# Patient Record
Sex: Female | Born: 1950 | Race: White | Hispanic: No | Marital: Married
Health system: Northeastern US, Academic
[De-identification: ages and names within clinical notes are randomized; demographics above are authoritative.]

---

## 2015-08-11 IMAGING — CT CT ABDOMEN AND PELVIS WITH CONTRAST
2 of 3 series · 16 of 46 positions shown, 18 images · IV contrast (isovue)
Comparison: There are no previous exams available for comparison.

CT ABDOMEN AND PELVIS WITH CONTRAST, 08/11/2015 [DATE]:
CLINICAL INDICATION:  Left lower quadrant pain. Acute onset this morning. Quit
smoking in 1443. Previous cholecystectomy.
A search for DICOM formatted images was conducted for prior CT imaging studies
completed at a non-affiliated media free facility.
TECHNIQUE: The region of interest was scanned with contrast on a high
resolution low dose CT scanner.   100 cc's of Isovue 300 was injected
intravenously.  Routine MPR reconstructions were performed.

[Series 5: abd/pel ax w · axial · 0.97mm/px · z∈[-472,-52]mm · 13 of 162 slices shown, 15 images]
[im 11/162  soft-tissue]
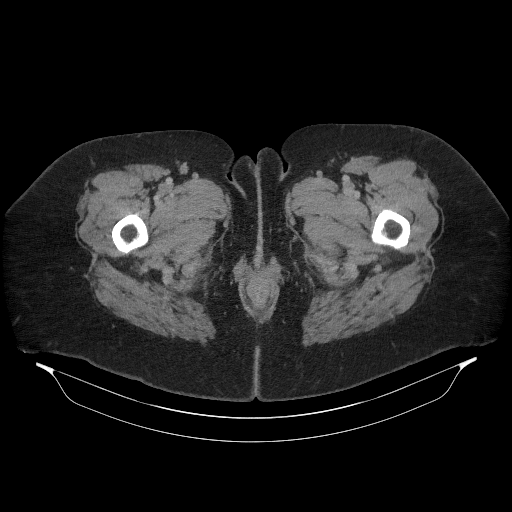
[im 11/162  bone]
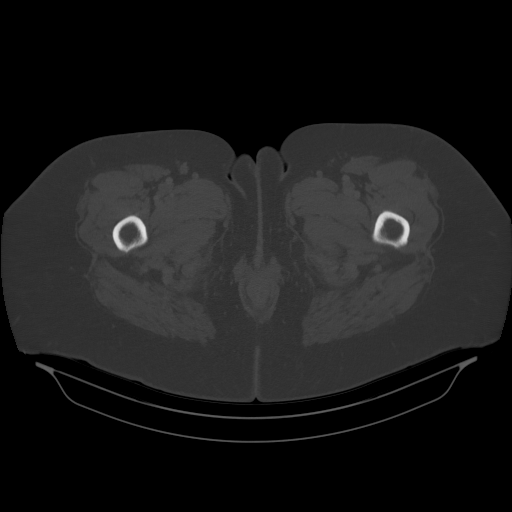
[im 21/162  soft-tissue]
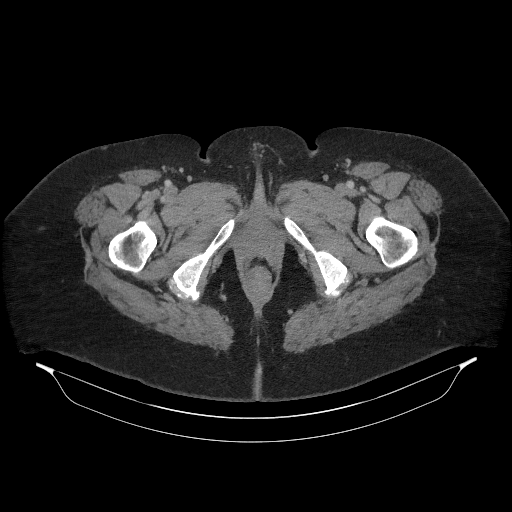
[im 32/162  soft-tissue]
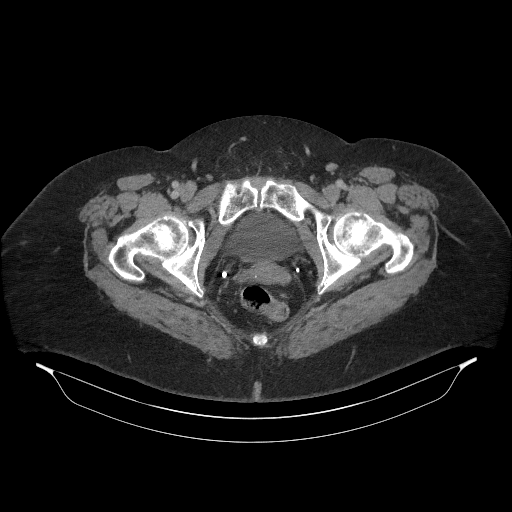
[im 47/162  soft-tissue]
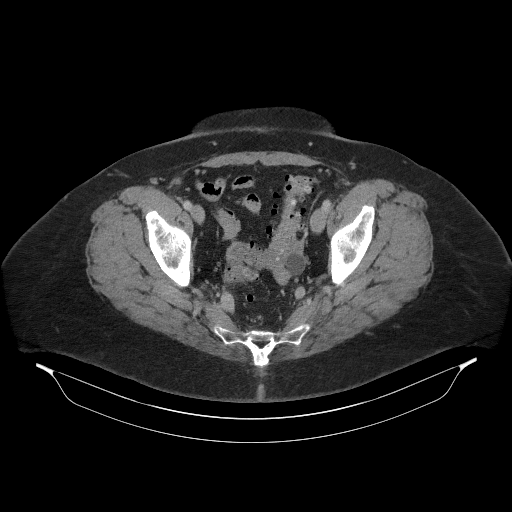
[im 58/162  soft-tissue]
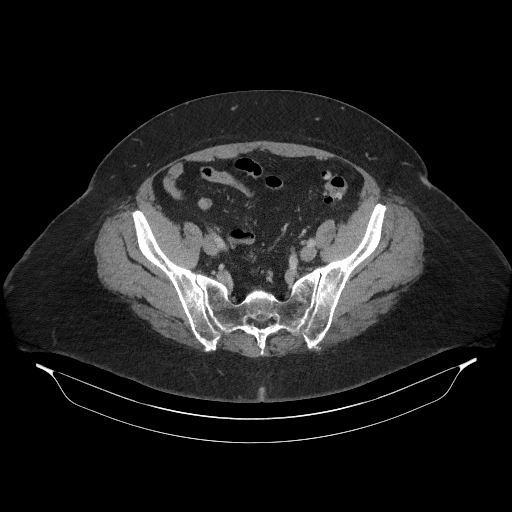
[im 68/162  soft-tissue]
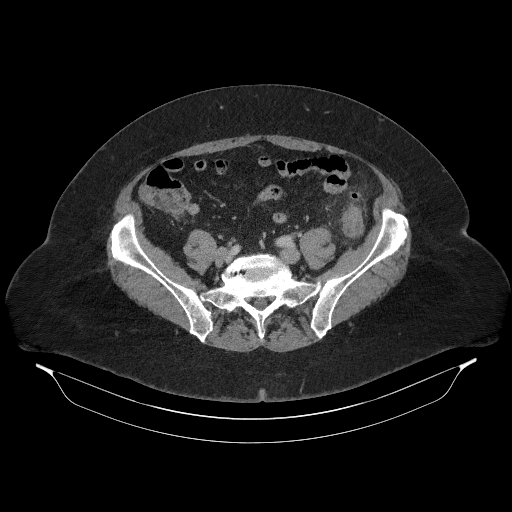
[im 84/162  soft-tissue]
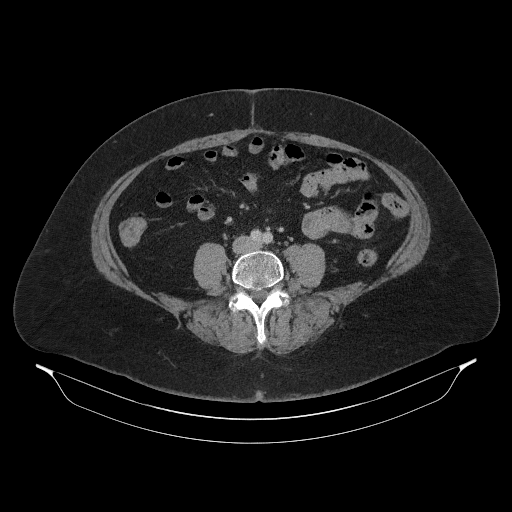
[im 94/162  soft-tissue]
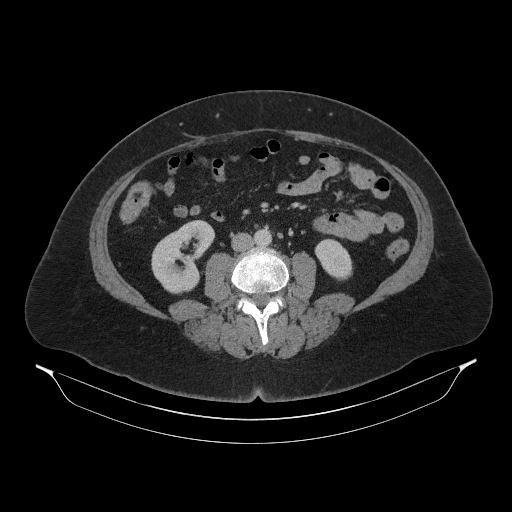
[im 104/162  soft-tissue]
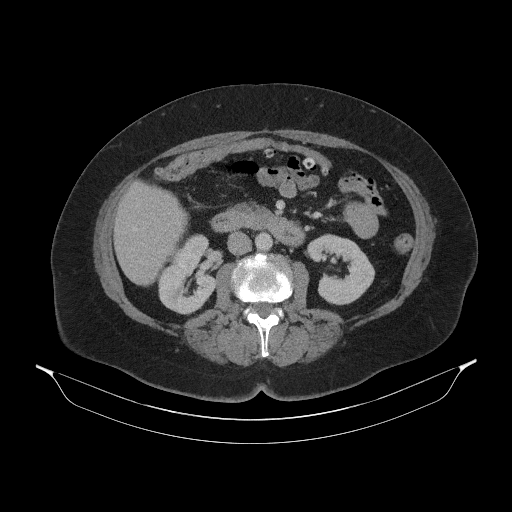
[im 104/162  bone]
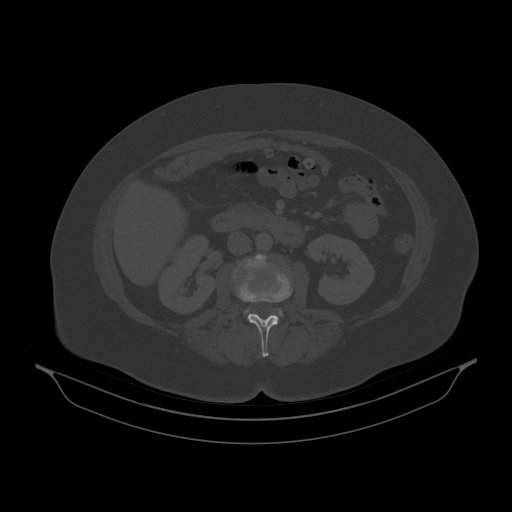
[im 115/162  soft-tissue]
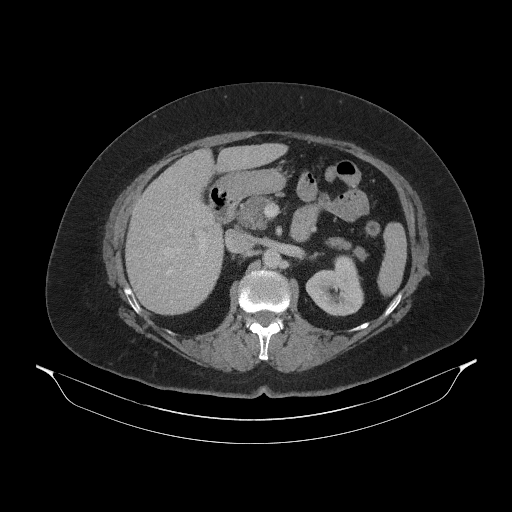
[im 130/162  soft-tissue]
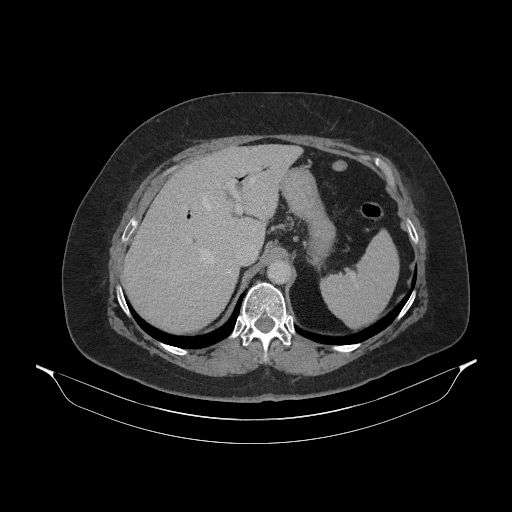
[im 141/162  soft-tissue]
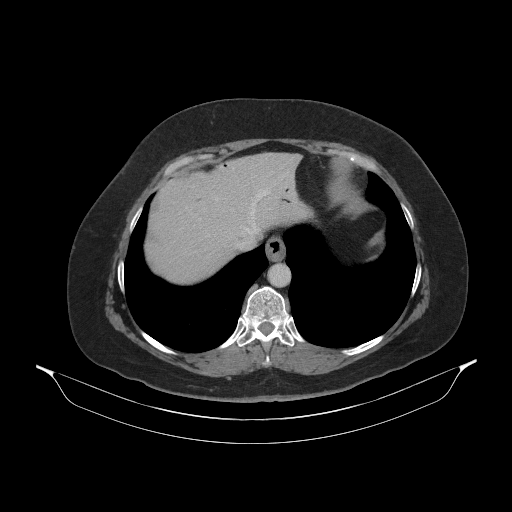
[im 151/162  soft-tissue]
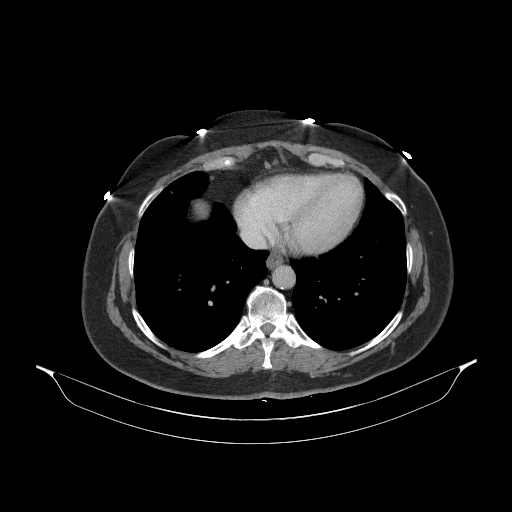

[Series 6: abd/pel cor w · coronal · 0.87mm/px · 3 of 143 slices shown]
[im 48/143  soft-tissue]
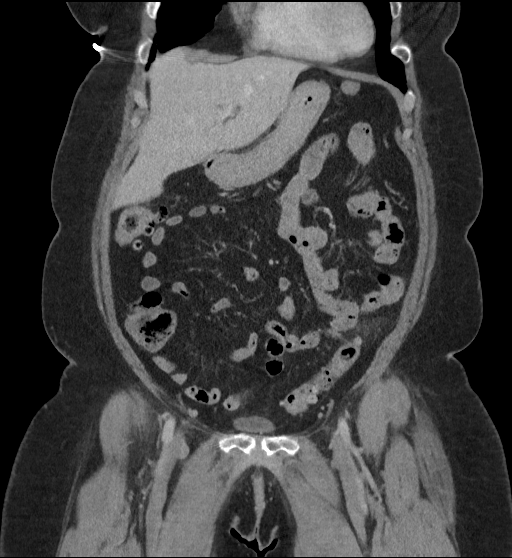
[im 64/143  soft-tissue]
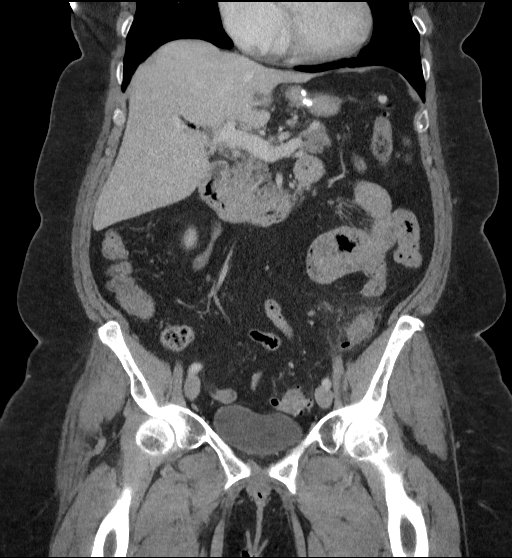
[im 79/143  soft-tissue]
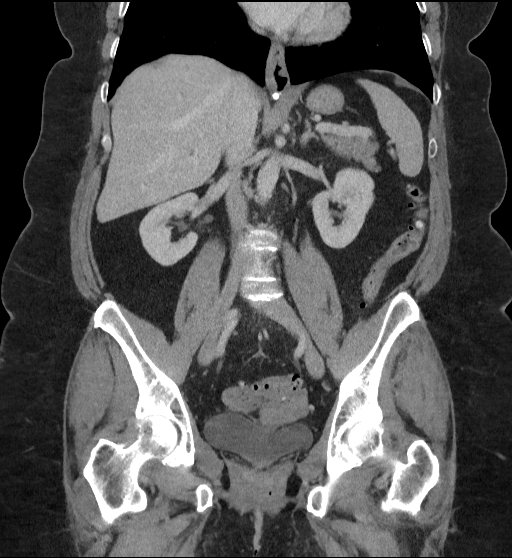

[16 of 46 positions shown; findings below may reference images not displayed]

FINDINGS: The lung bases are clear. Postop changes are seen at the GE
junction
consistent previous surgery of the stomach. Air is seen in the biliary tree
thought to be consistent with previous biliary manipulation. The patient has
had
previous cholecystectomy. The liver, spleen, pancreas, adrenal glands and
kidneys are otherwise unremarkable in appearance. No obstructive changes seen.
Normal appendix is visualized. Diverticulosis. There appears to be active
diverticulitis involving the junction of the descending and sigmoid colon.
There
is a 2 cm left adnexal cyst. I do not see free fluid, abscess or free air. The
bladder is unremarkable in appearance. Degenerative changes and mild
atherosclerotic changes.
IMPRESSION: What appears to be uncomplicated diverticulitis of the junction of the
descending and sigmoid colon.
Postop changes.
Mild atherosclerotic changes and degenerative changes.
2 cm left adnexal cyst. Consider follow-up with sonography in this
postmenopausal patient.
RADIATION DOSE REDUCTION: All CT scans are performed using radiation dose
reduction techniques, when applicable.  Technical factors are evaluated and
adjusted to ensure appropriate moderation of exposure.  Automated dose
management technology is applied to adjust the radiation doses to minimize
exposure while achieving diagnostic quality images.

## 2018-08-02 IMAGING — MG MAMMOGRAPHY SCREENING BILATERAL 3D TOMOSYNTHESIS WITH CAD
8 series · 8 of 24 positions shown · non-contrast
Comparison: Mammogram 03/19/2015. 
BREAST DENSITY: (Level B) There are scattered areas of fibroglandular density.

MAMMOGRAPHY SCREENING BILATERAL 3D TOMOSYNTHESIS WITH CAD, 08/02/2018 [DATE]: 
CLINICAL INDICATION: Screening.
TECHNIQUE: Digital bilateral mammograms and 3-D Tomosynthesis were obtained. 
These were interpreted both primarily and with the aid of computer-aided 
detection system.

[R MLO]
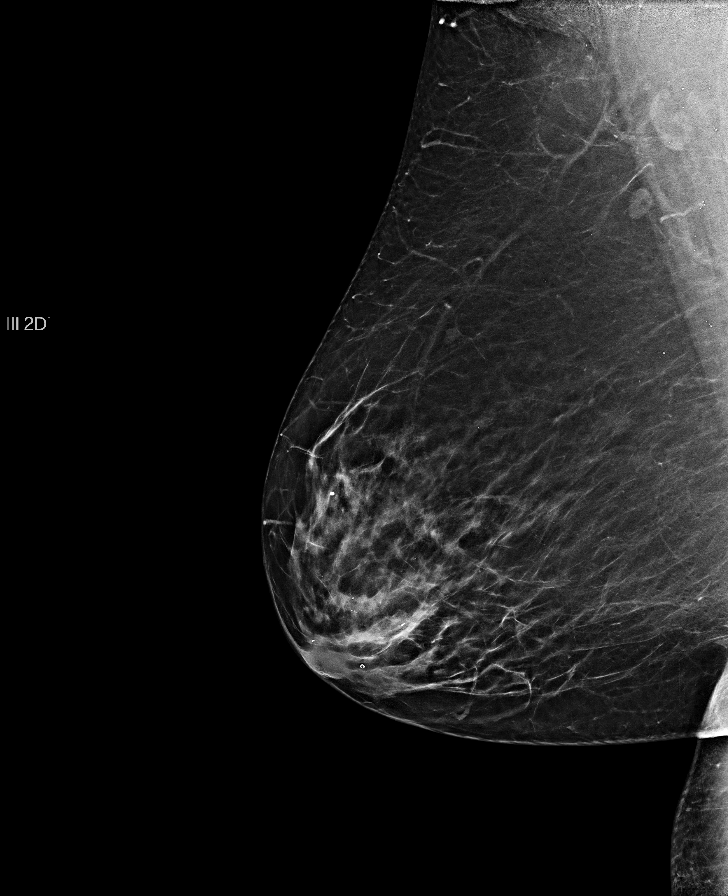

[L MLO]
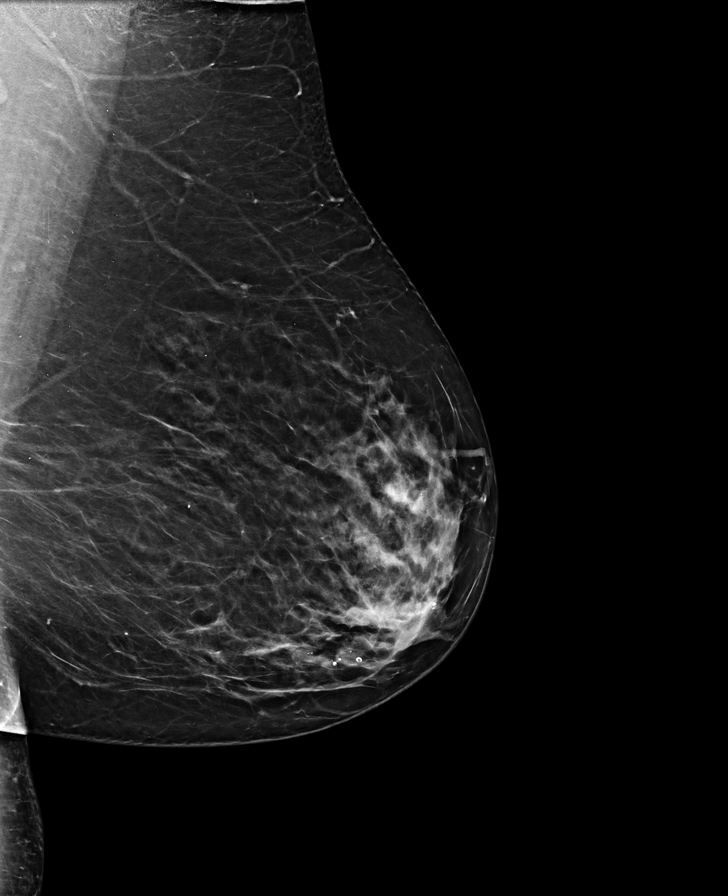

[L CC]
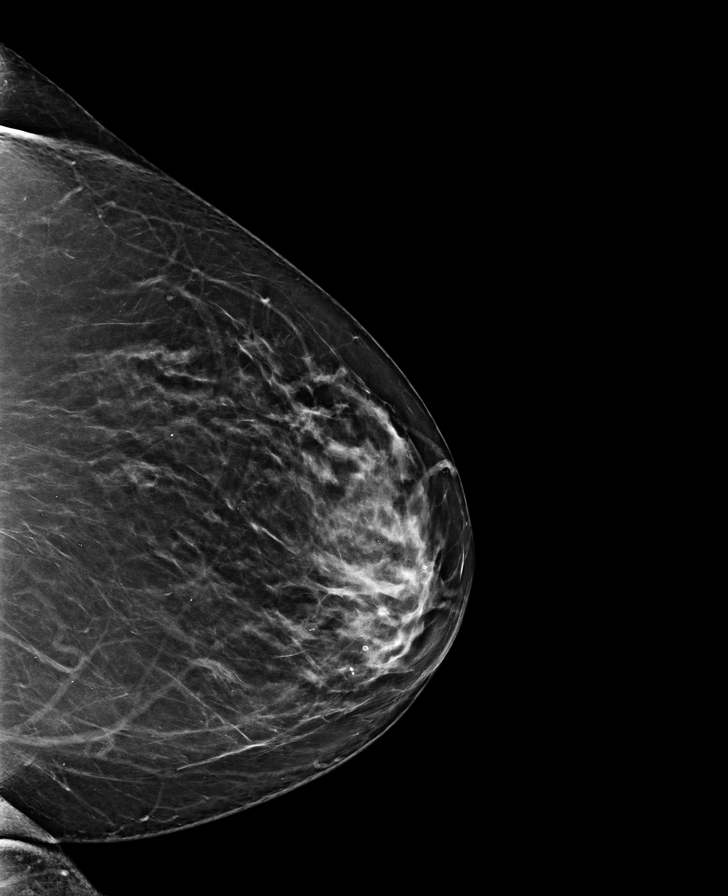

[R CC]
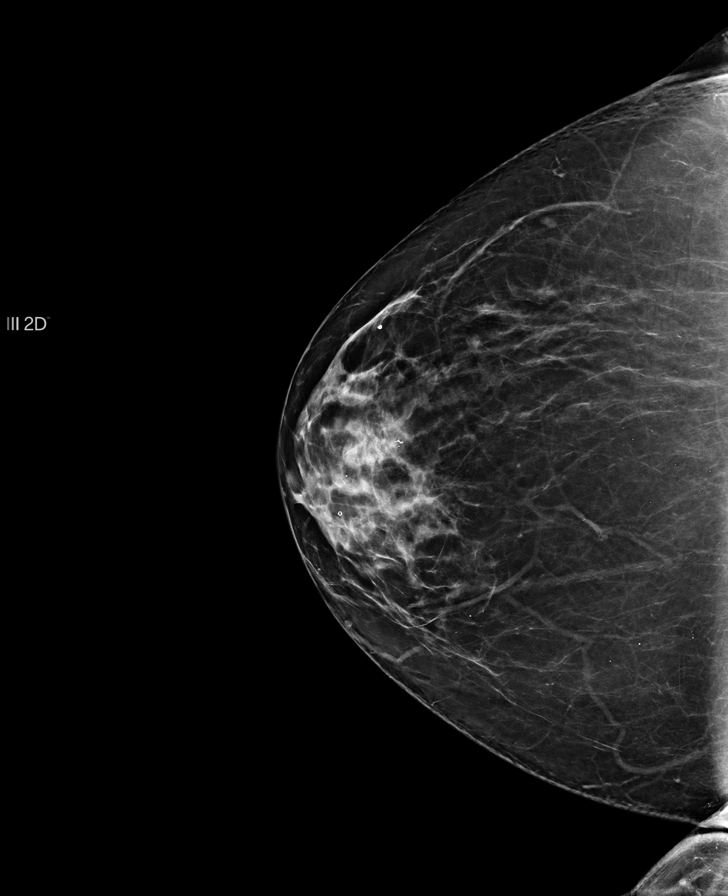

[R CC tomo · tomo slice 40/79.0]
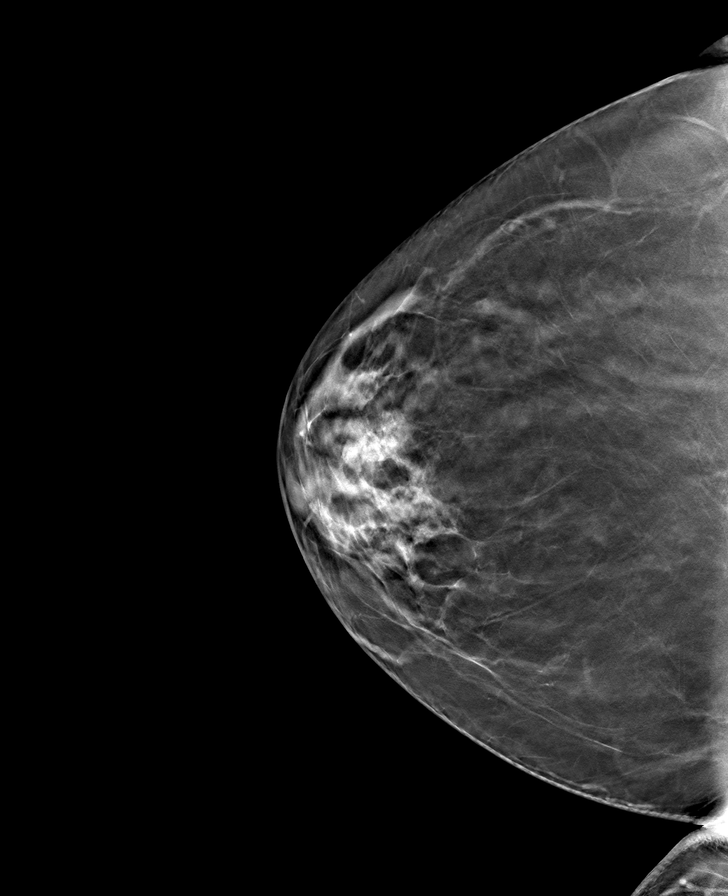

[L MLO tomo · tomo slice 43/86.0]
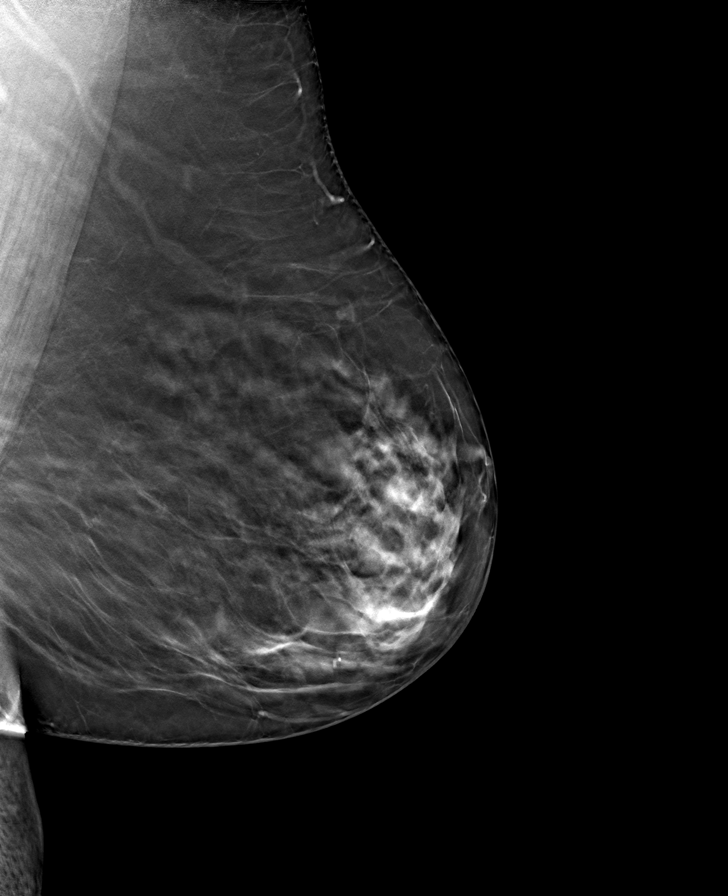

[L CC tomo · tomo slice 41/82.0]
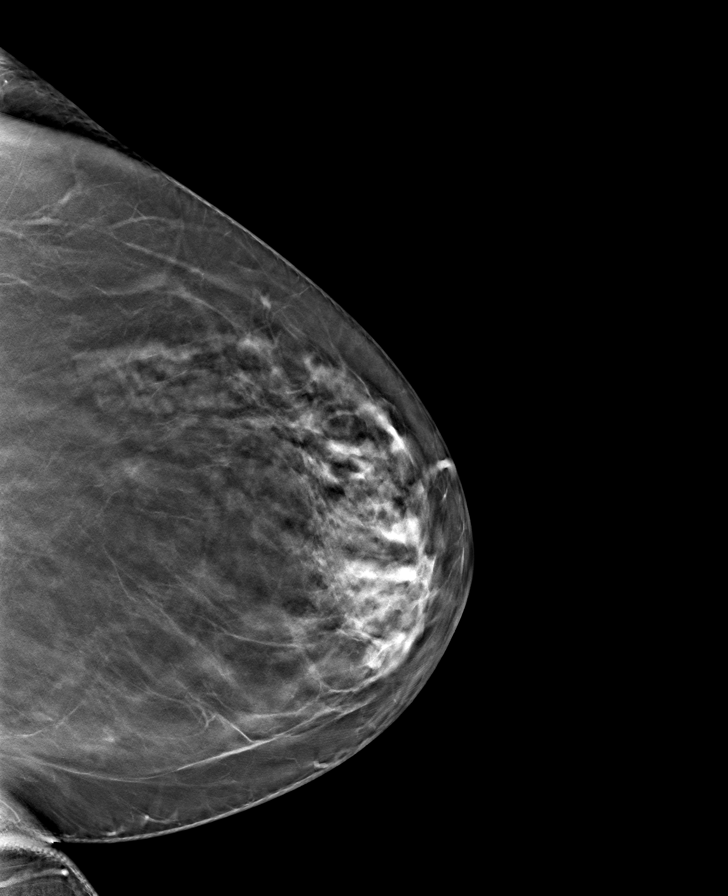

[R MLO tomo · tomo slice 43/86.0]
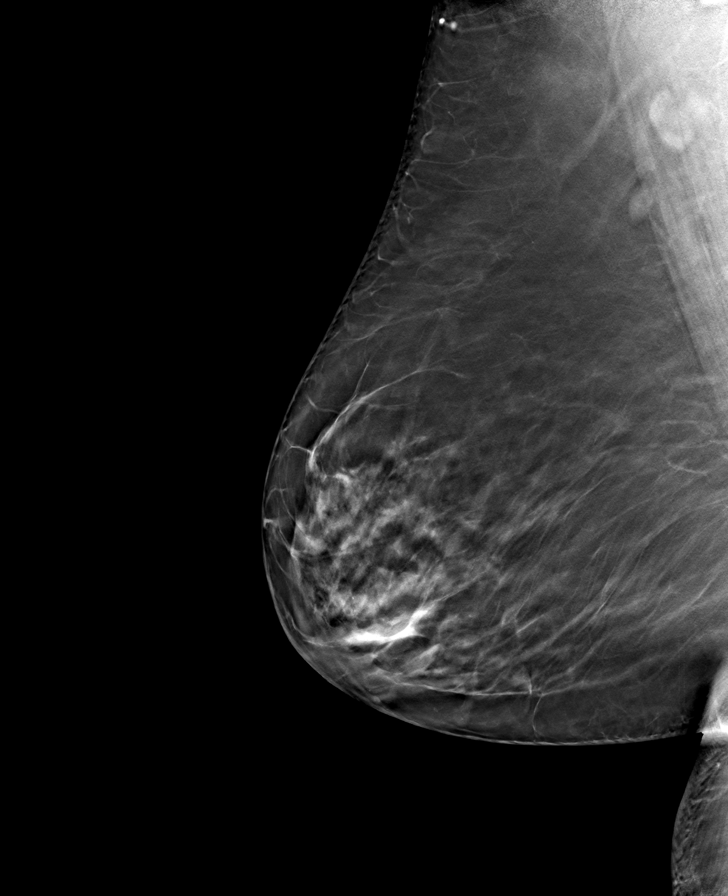

[8 of 24 positions shown; findings below may reference images not displayed]

FINDINGS: Scattered benign-appearing micro and macrocalcifications. No 
mammographically suspicious abnormality and no significant change.
IMPRESSION: ( BI-RADS 2) Benign findings. Routine mammographic follow-up is recommended.

## 2018-09-28 IMAGING — DX KNEE 4 VIEWS LEFT
1 series · 4 of 4 positions shown · non-contrast
Comparison: none

KNEE 4 VIEWS LEFT, 09/28/2018 [DATE]: 
CLINICAL INDICATION:  Left knee pain, no trauma 
COMPARISON EXAMINATIONS: None

[Series 1: AP · U · 0.14mm/px · 4 of 4 slices shown]
[im 1/4]
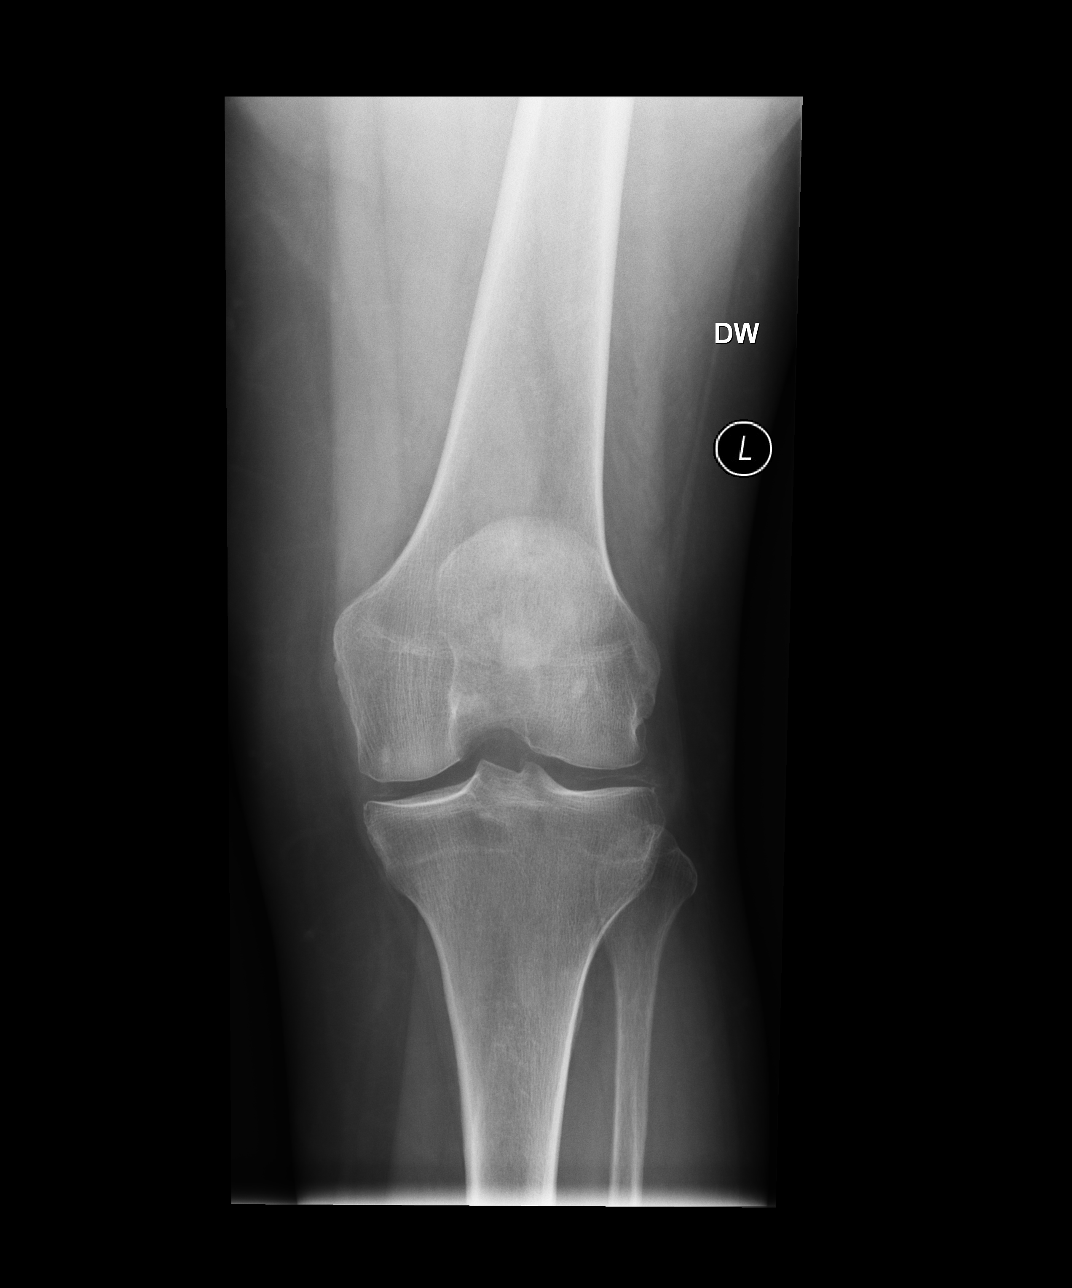
[im 2/4]
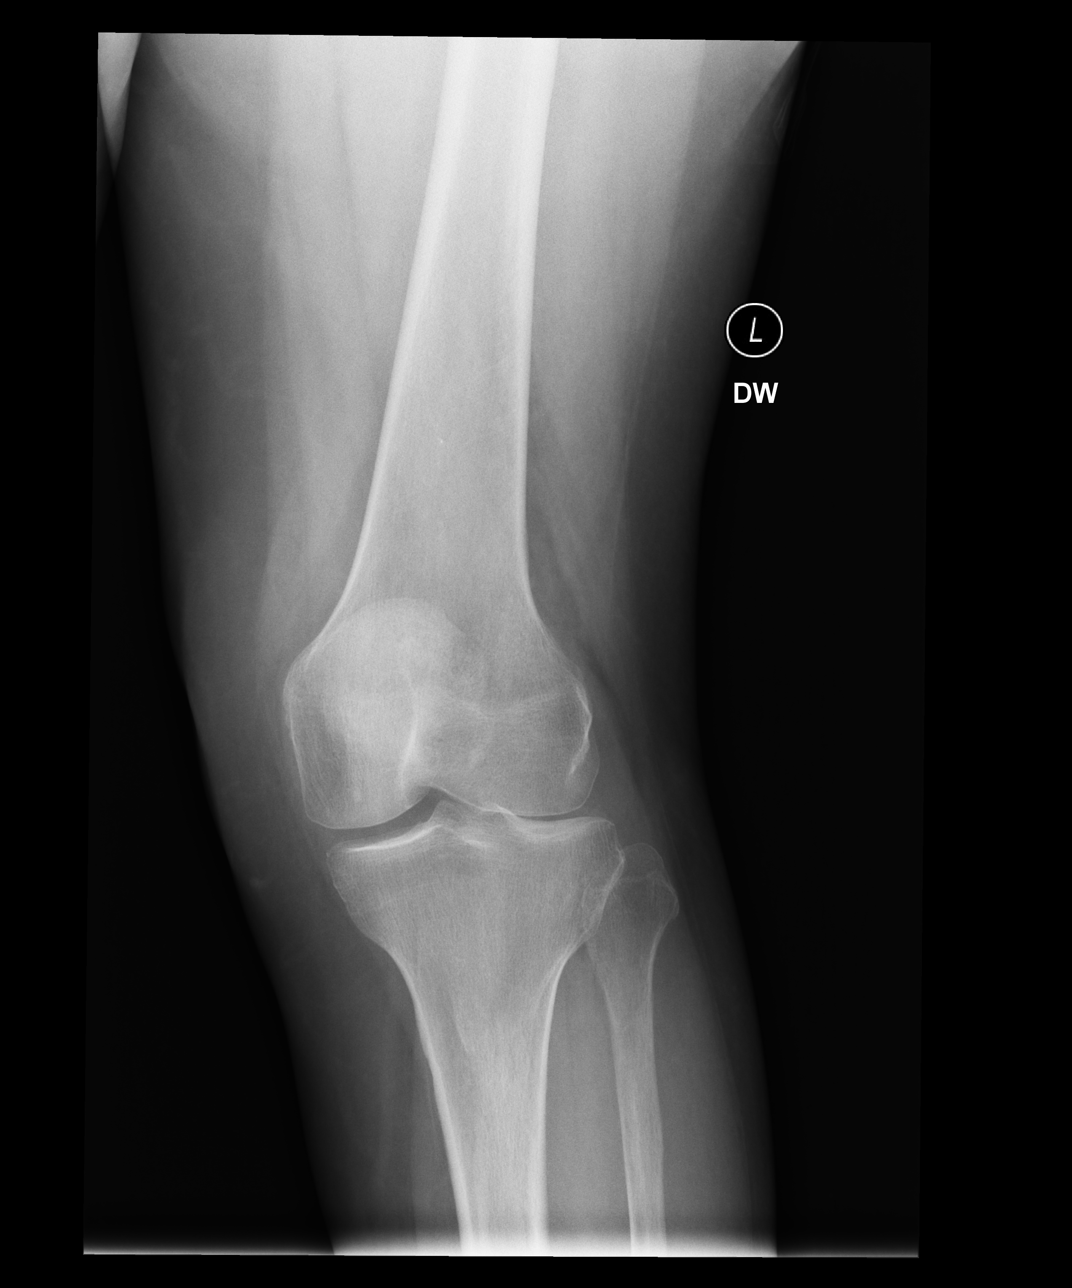
[im 3/4]
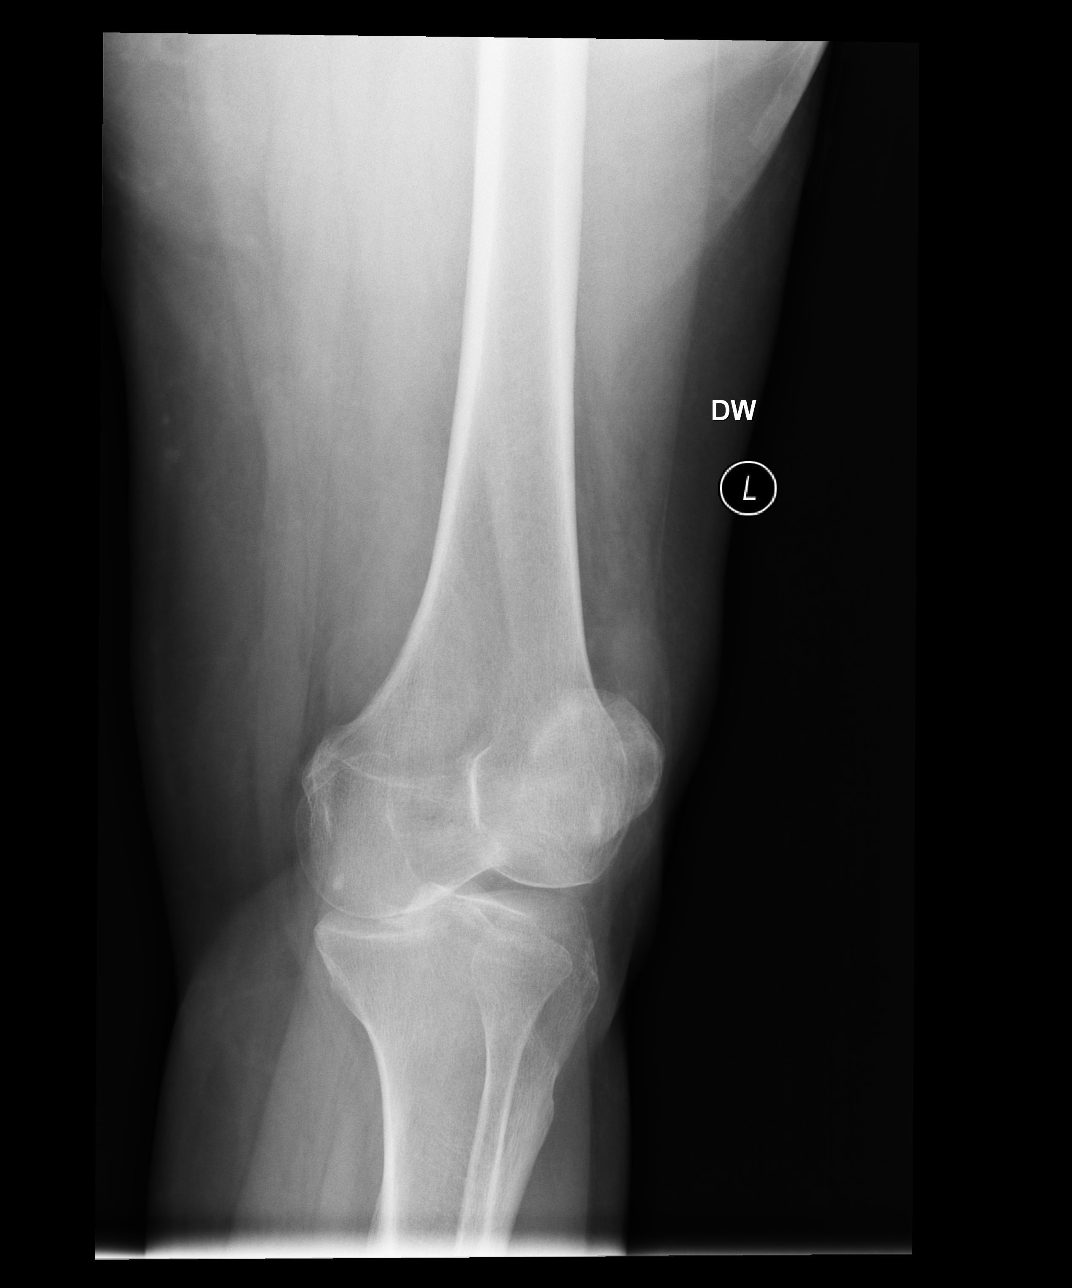
[im 4/4]
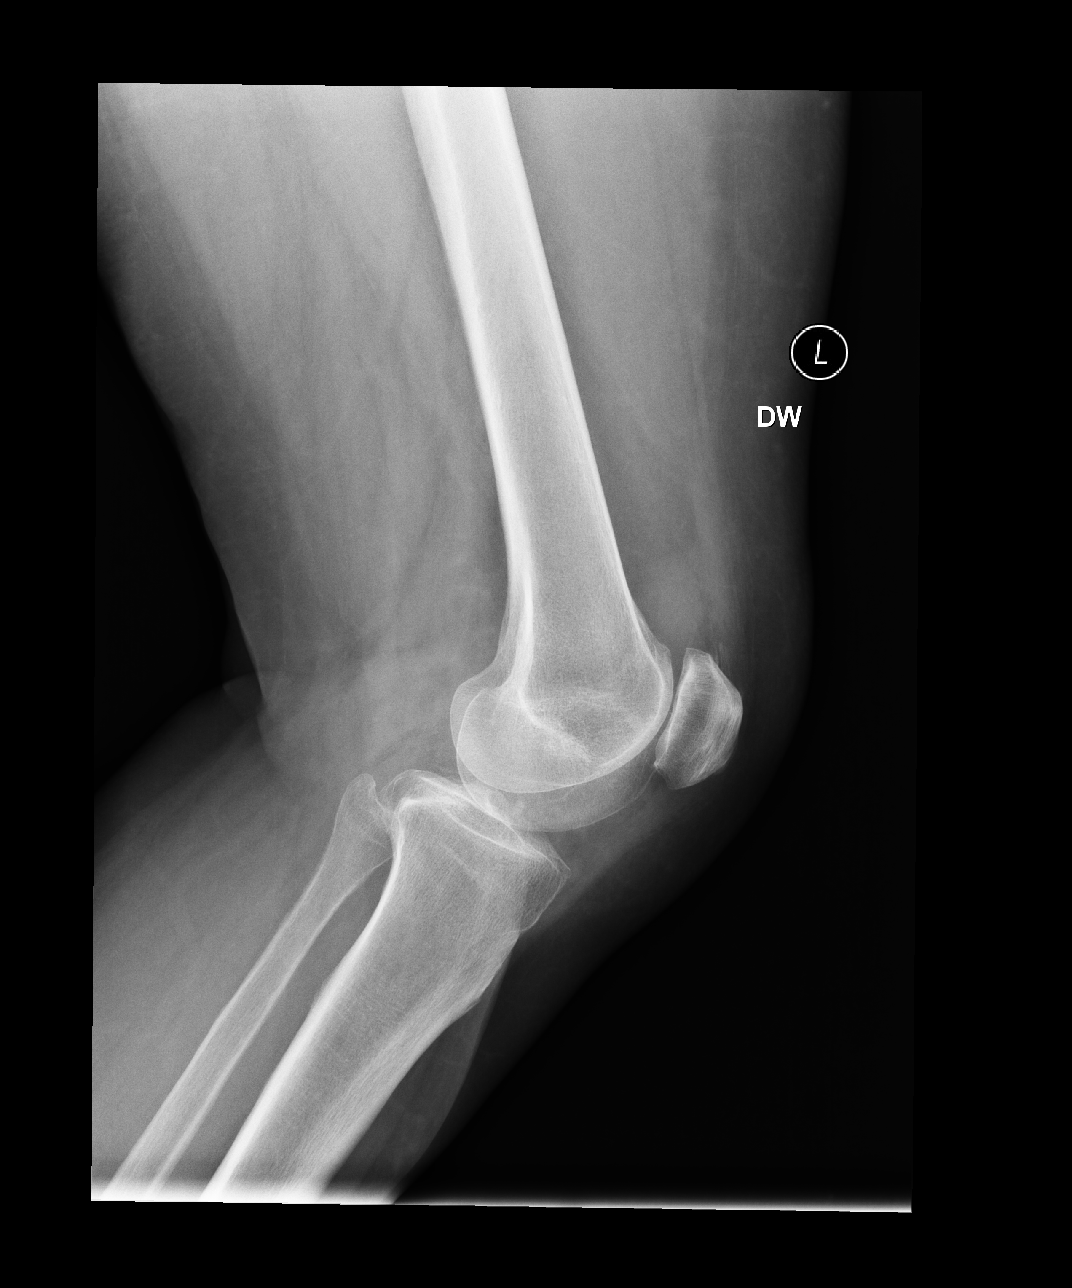

[4 of 4 positions shown; findings below may reference images not displayed]

FINDINGS: No acute fracture or dislocation. There is no evidence of calcium 
pyrophosphate deposition disease. Mild degenerative changes of the left knee. 
Small knee joint effusion.
IMPRESSION: Mild degenerative change with evidence of calcium pyrophosphate deposition 
disease. 
Small knee joint effusion. No acute fracture or dislocation.

## 2019-07-08 LAB — VITAMIN D, 25-HYDROXY
BKR VITAMIN D, 25-HYDROXY TOTAL (YH): 58 ng/mL (ref 30–100)
BKR VITAMIN D2, 25-HYDROXY: <5 ng/mL
BKR VITAMIN D3, 25-HYDROXY: 58 ng/mL

## 2019-10-01 IMAGING — MG MAMMOGRAPHY SCREENING BILATERAL 3D TOMOSYNTHESIS WITH CAD
8 series · 8 of 24 positions shown · non-contrast
Comparison: Comparison is made to the prior exams dating back to 6918 
BREAST DENSITY: (Level C) The breasts are heterogeneously dense, which may 
obscure small masses.

MAMMOGRAPHY SCREENING BILATERAL 3D TOMOSYNTHESIS WITH CAD, 10/01/2019 [DATE]: 
CLINICAL INDICATION: Screening
TECHNIQUE: Digital bilateral mammograms and 3-D Tomosynthesis were obtained. 
These were interpreted both primarily and with the aid of computer-aided 
detection system.

[L CC]
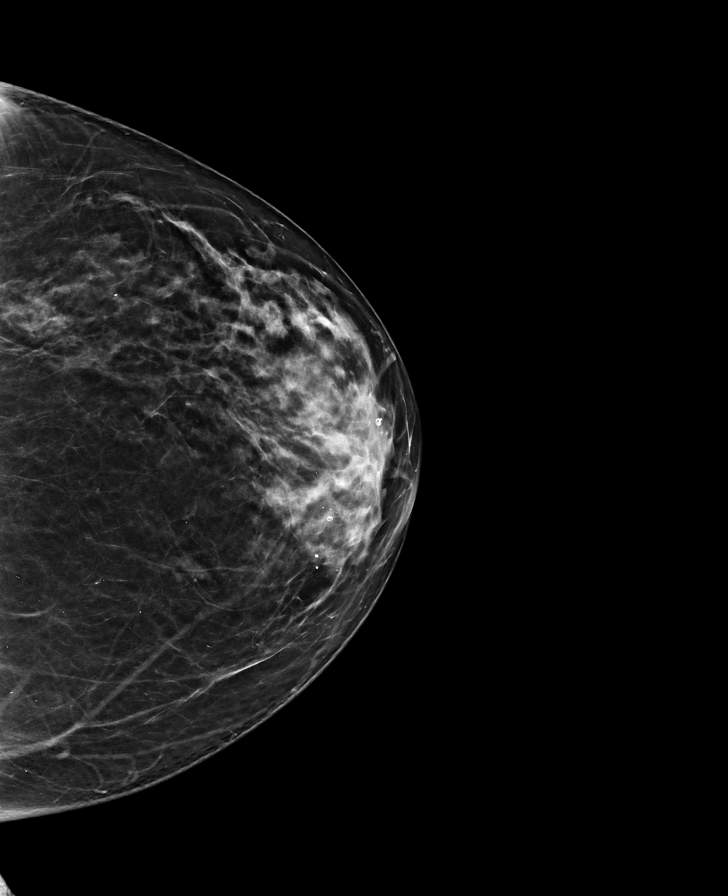

[L MLO]
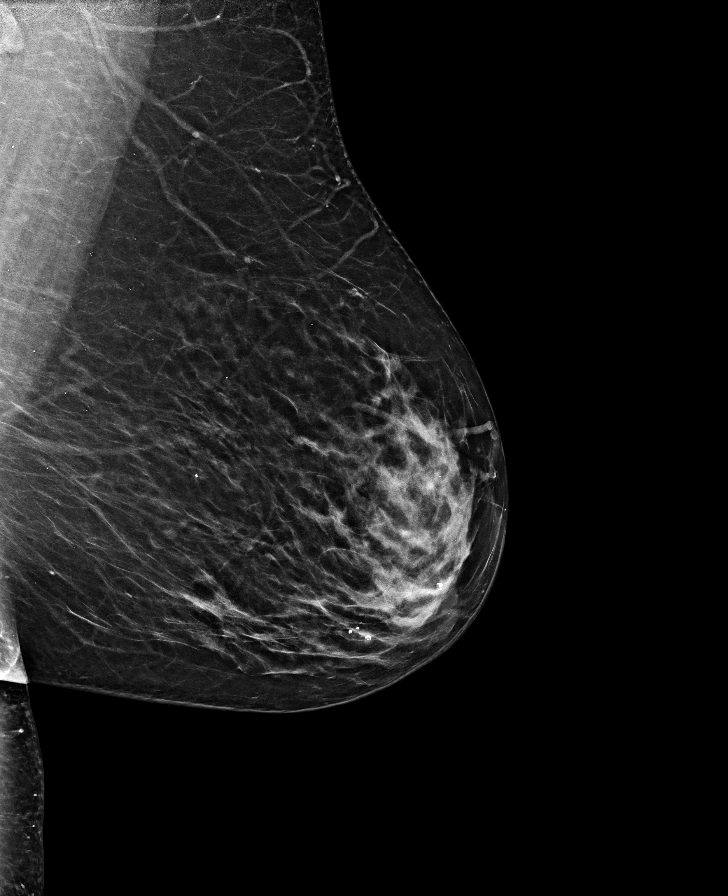

[R CC]
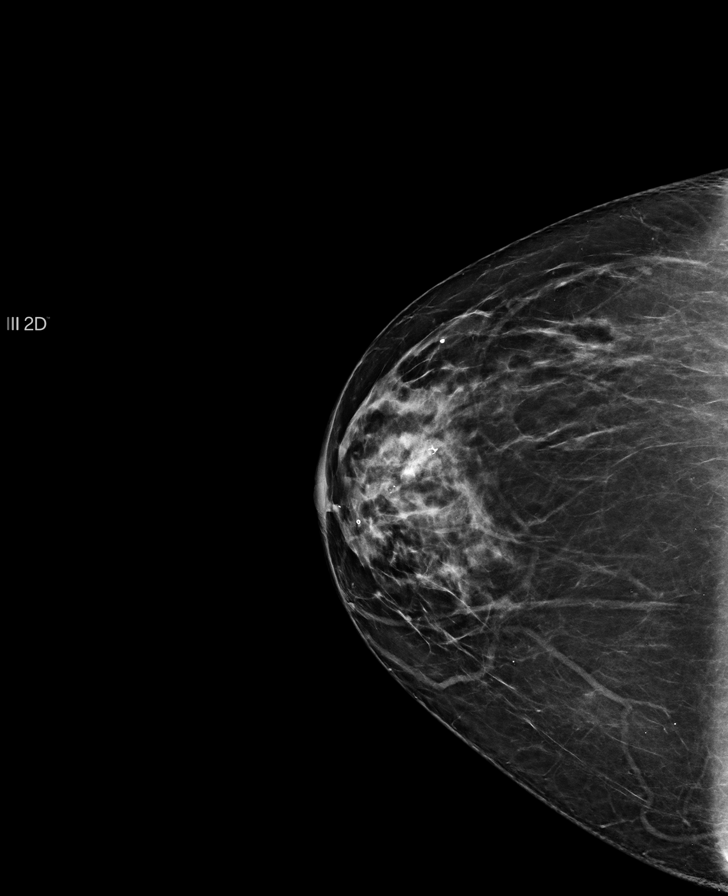

[R MLO]
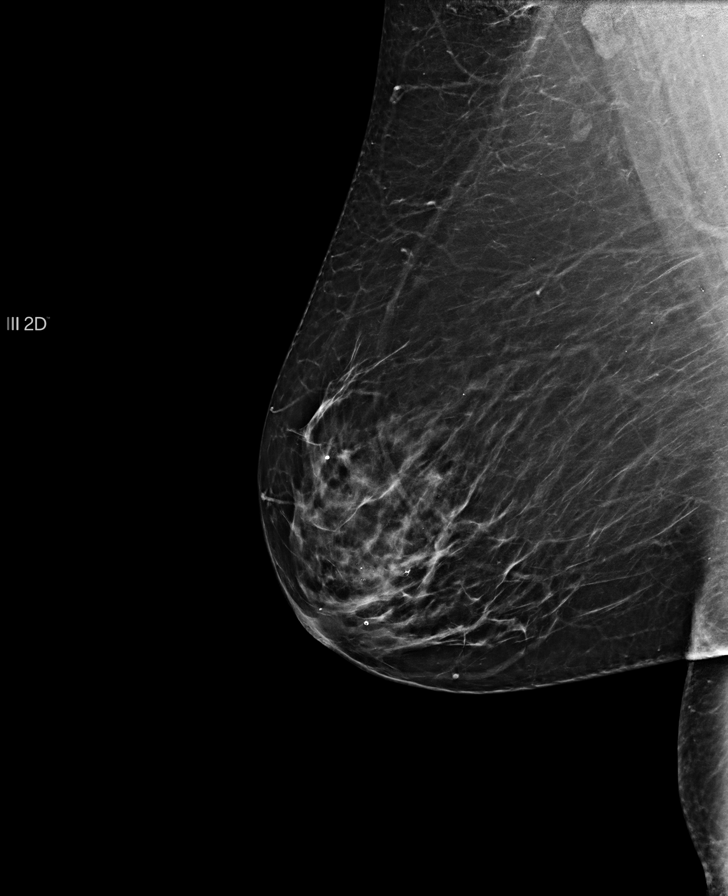

[R MLO tomo · tomo slice 45/89.0]
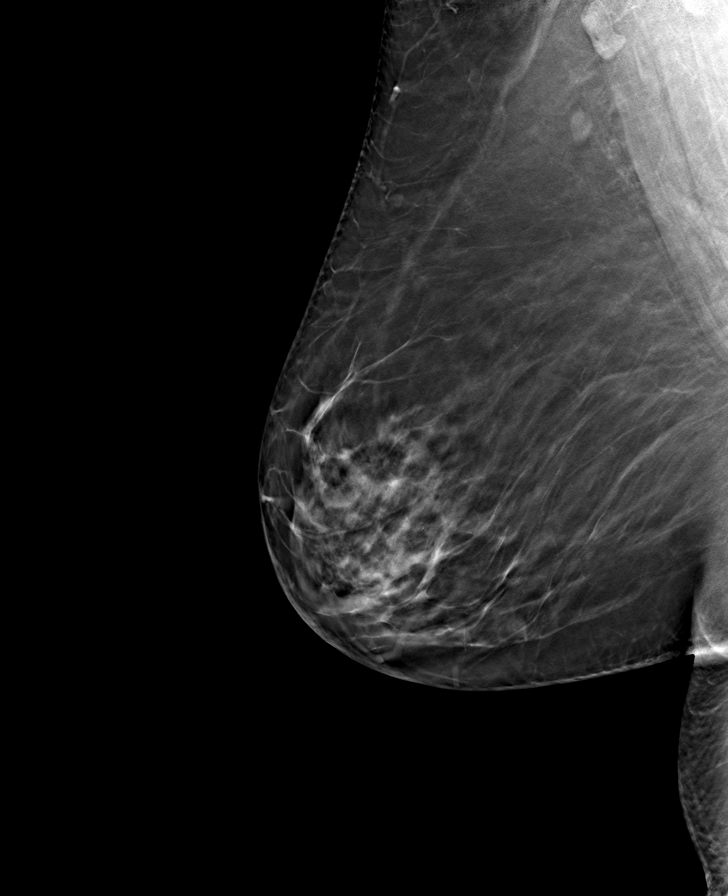

[L MLO tomo · tomo slice 41/82.0]
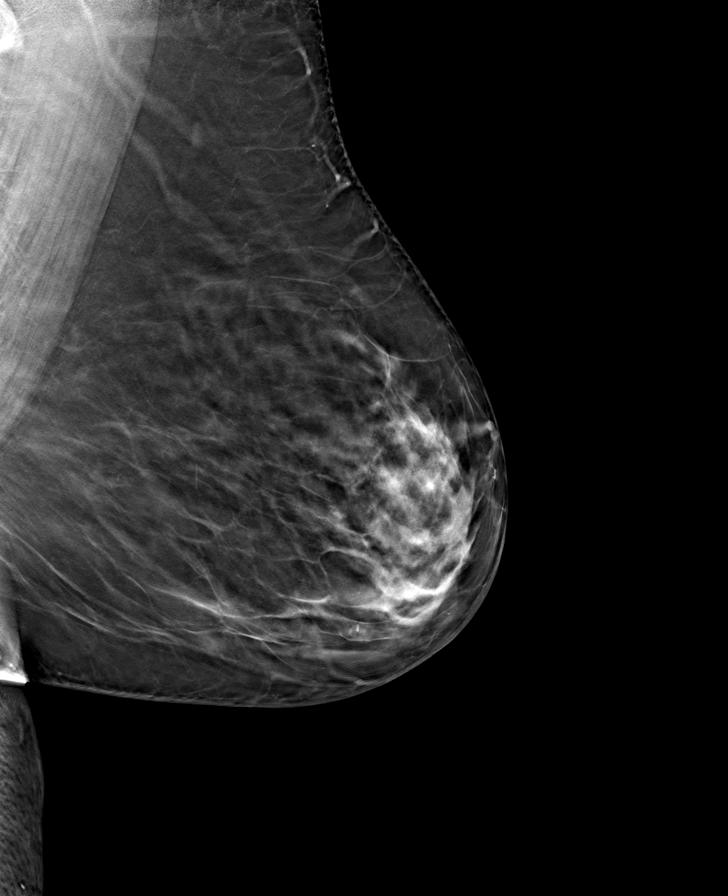

[R CC tomo · tomo slice 39/78.0]
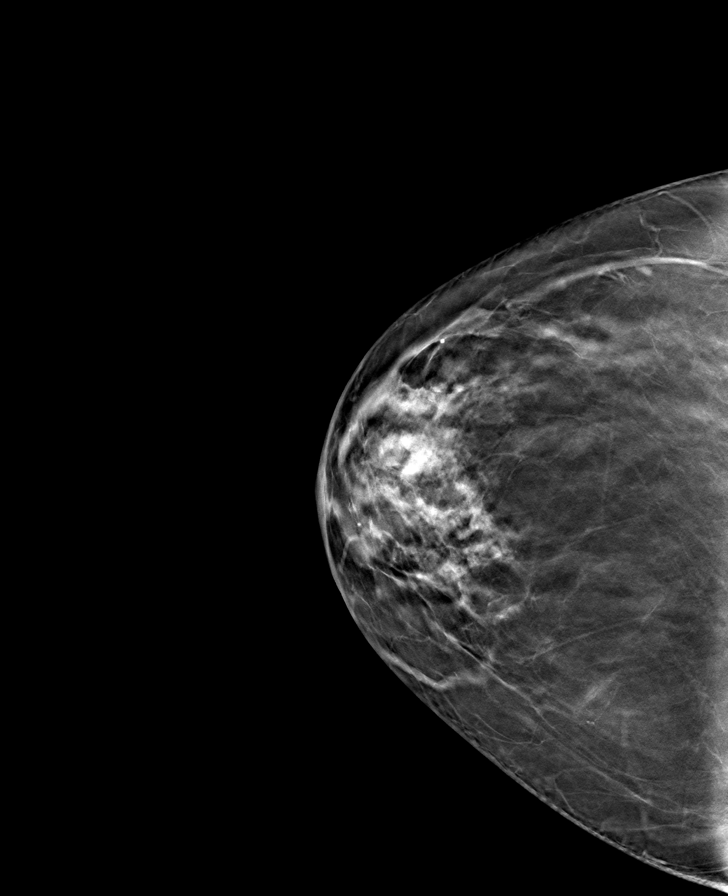

[L CC tomo · tomo slice 35/69.0]
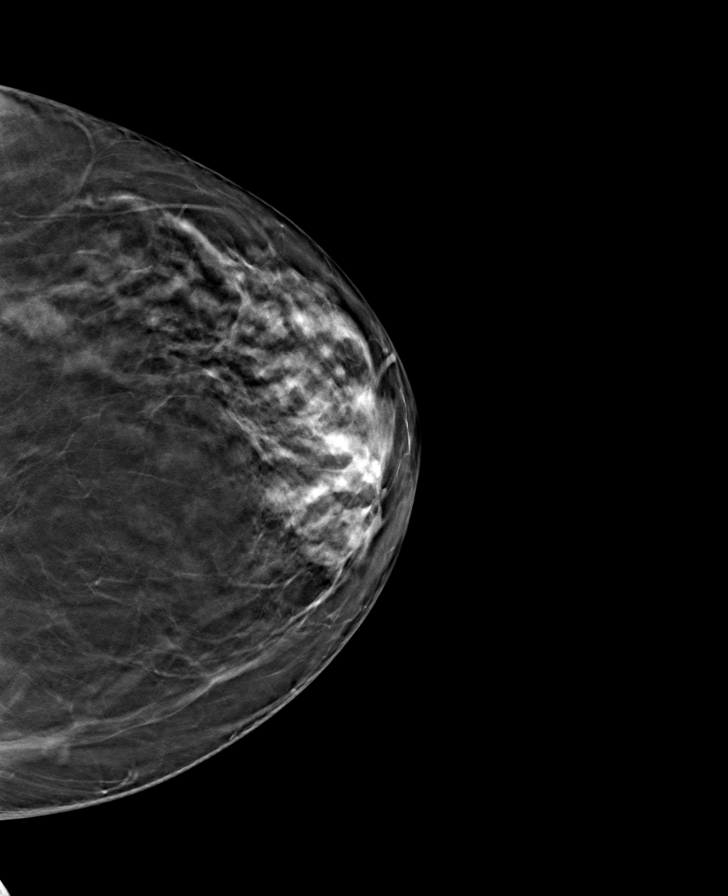

[8 of 24 positions shown; findings below may reference images not displayed]

FINDINGS: There is no evidence of mass or malignant appearing microcalcifications 
seen on today's examination. No skin thickening is identified. There is thought 
to be an overall stable mammographic appearance.
IMPRESSION: ( BI-RADS 2) Benign findings. Routine mammographic follow-up is recommended.

## 2019-10-08 ENCOUNTER — Encounter: Admit: 2019-10-08 | Payer: PRIVATE HEALTH INSURANCE | Primary: Internal Medicine

## 2019-11-28 ENCOUNTER — Encounter
Admit: 2019-11-28 | Payer: PRIVATE HEALTH INSURANCE | Attending: Vascular and Interventional Radiology | Primary: Internal Medicine

## 2019-12-10 ENCOUNTER — Encounter
Admit: 2019-12-10 | Payer: PRIVATE HEALTH INSURANCE | Attending: Vascular and Interventional Radiology | Primary: Internal Medicine

## 2020-01-10 ENCOUNTER — Encounter
Admit: 2020-01-10 | Payer: PRIVATE HEALTH INSURANCE | Attending: Vascular and Interventional Radiology | Primary: Internal Medicine

## 2020-01-15 ENCOUNTER — Encounter: Admit: 2020-01-15 | Payer: PRIVATE HEALTH INSURANCE | Primary: Internal Medicine

## 2020-01-16 ENCOUNTER — Encounter: Admit: 2020-01-16 | Payer: PRIVATE HEALTH INSURANCE | Primary: Internal Medicine

## 2020-01-16 ENCOUNTER — Ambulatory Visit: Admit: 2020-01-16 | Payer: MEDICARE | Primary: Internal Medicine

## 2020-01-16 DIAGNOSIS — M353 Polymyalgia rheumatica: Secondary | ICD-10-CM

## 2020-01-16 DIAGNOSIS — K648 Other hemorrhoids: Secondary | ICD-10-CM

## 2020-01-16 DIAGNOSIS — Z1211 Encounter for screening for malignant neoplasm of colon: Secondary | ICD-10-CM

## 2020-01-16 DIAGNOSIS — S42291A Other displaced fracture of upper end of right humerus, initial encounter for closed fracture: Secondary | ICD-10-CM

## 2020-01-16 DIAGNOSIS — G5600 Carpal tunnel syndrome, unspecified upper limb: Secondary | ICD-10-CM

## 2020-01-16 DIAGNOSIS — H3530 Unspecified macular degeneration: Secondary | ICD-10-CM

## 2020-01-16 DIAGNOSIS — J45909 Unspecified asthma, uncomplicated: Secondary | ICD-10-CM

## 2020-01-16 DIAGNOSIS — L719 Rosacea, unspecified: Secondary | ICD-10-CM

## 2020-01-16 DIAGNOSIS — Z01419 Encounter for gynecological examination (general) (routine) without abnormal findings: Secondary | ICD-10-CM

## 2020-01-16 DIAGNOSIS — N904 Leukoplakia of vulva: Secondary | ICD-10-CM

## 2020-01-16 DIAGNOSIS — IMO0001 Elevated BP: Secondary | ICD-10-CM

## 2020-01-16 DIAGNOSIS — R7303 Prediabetes: Secondary | ICD-10-CM

## 2020-01-16 DIAGNOSIS — N83202 Unspecified ovarian cyst, left side: Secondary | ICD-10-CM

## 2020-01-16 DIAGNOSIS — N952 Postmenopausal atrophic vaginitis: Secondary | ICD-10-CM

## 2020-01-16 DIAGNOSIS — F419 Anxiety disorder, unspecified: Secondary | ICD-10-CM

## 2020-01-16 DIAGNOSIS — E669 Obesity, unspecified: Secondary | ICD-10-CM

## 2020-01-16 DIAGNOSIS — H01009 Unspecified blepharitis unspecified eye, unspecified eyelid: Secondary | ICD-10-CM

## 2020-01-16 NOTE — Progress Notes
HPI:   Erin Walker is a 69 y.o. female who presents to this practice with a recently found 3 cm left ovarian cyst on an MRI done for left hip issues. She also has had longstanding lichen sclerosis - not on meds for some time with return of vulvar pruritis. No LMP recorded. Patient is postmenopausal.Patient History?	 Problem List: has Obesity, Class II, BMI 35-39.9; Atrophic vaginitis; Knee pain; Skin lesion of scalp; Elevated BP; Prediabetes; Lichen sclerosus of female genitalia; Well woman exam with routine gynecological exam; Macular degeneration, right eye; Reactive airway disease without asthma; Polymyalgia rheumatica (HC Code); Tachycardia; and Current chronic use of systemic steroids on their problem list. ?	Past Surgical History: has a past surgical history that includes Ovarian cyst removal; Adenoidectomy; Tonsillectomy; Hysteroscopy; Dilation and curettage of uterus; ERCP; Cholecystectomy, laparoscopic; Carpal tunnel release (Bilateral); and Vertical Banded Gastroplasty.?	Past Medical History: has a past medical history of Anxiety disorder, Asthma, Blepharitis with rosacea, Closed 4-part fracture of proximal humerus, right, initial encounter (03/19/2019), CTS (carpal tunnel syndrome), Elevated BP (10/22/2013), Internal hemorrhoids, Macular degeneration, Obesity, Polymyalgia rheumatica (HC Code), Prediabetes, and Rosacea.?	Family History: family history includes Breast cancer (age of onset: 75) in her mother; Cancer in her brother and father; Heart disease in her sister; Hypertension in her sister; Kidney disease in her sister; No Known Problems in her brother and sister; Pancreatic cancer in her maternal grandfather, maternal uncle, and mother.?	Allergies:is allergic to celebrex [celecoxib]; codeine; nsaids (non-steroidal anti-inflammatory drug); and adhesive tape-silicones. ?	Medications: ?	Current Outpatient Medications: ?	?  biotin, 1,000 mcg, Oral, QAM?	? cholecalciferol (vitamin D3), 1,000 Units, Oral, QAM?	?  clobetasoL, Apply topically once a week.?	?  meclizine, 12.5 mg, Oral, TID PRN?	?  vit C/E/Zn/coppr/lutein/zeaxan (PRESERVISION AREDS-2 ORAL), 1 tablet, Oral, BID ?	Social History: reports that she quit smoking about 24 years ago. She has never used smokeless tobacco. She reports current alcohol use of about 7.0 - 10.0 standard drinks of alcohol per week. She reports that she does not use drugs. ?	Menstrual/Sexual History: pm OB History Gravida Para Term Preterm AB Living 1       1   SAB TAB Ectopic Molar Multiple Live Births              # Outcome Date GA Lbr Len/2nd Weight Sex Delivery Anes PTL Lv 1 AB          Review of Systems Genitourinary: Negative for pelvic pain and vaginal bleeding. Musculoskeletal: Positive for arthralgias and myalgias. All other systems reviewed and are negative.  Objective:  BP (!) 140/90  - Temp 97.3 ?F (36.3 ?C) (Temporal)  - Ht 5' 7.25 (1.708 m)  - Wt 103.9 kg  - BMI 35.60 kg/m?  (229 lbs.)Physical ExamConstitutional:     Appearance: Normal appearance. She is obese. Genitourinary:    Pelvic exam was performed with patient in the lithotomy position.    Uterus normal. Rectum:    Guaiac result negative. Pulmonary:    Effort: Pulmonary effort is normal. Chest: Abdominal:    General: There is no distension.    Palpations: There is no mass.    Tenderness: There is no abdominal tenderness. Neurological:    Mental Status: She is alert.   Assessment / Plan:  Impression:Normal breast examPelvic + for lichen sclerosis - rx for temovate to areaLeft ovarian cyst is non palpable - but will order pelvic US to assess

## 2020-01-17 ENCOUNTER — Telehealth: Admit: 2020-01-17 | Payer: PRIVATE HEALTH INSURANCE | Primary: Internal Medicine

## 2020-01-17 MED ORDER — CLOBETASOL 0.05 % TOPICAL CREAM
0.05 % | Freq: Two times a day (BID) | TOPICAL | 3 refills | Status: AC
Start: 2020-01-17 — End: 2021-01-06

## 2020-01-17 NOTE — Telephone Encounter
rx is in pharm and confirmed as received Patient to call pharmacy and ask if it was rejected or what the problem is.

## 2020-01-17 NOTE — Telephone Encounter
Pt was seen yesterday with Dr Walker Kehr, was told meds would be called to pharm,  Not therePlease call

## 2020-01-17 NOTE — Telephone Encounter
Temovate cream 0.05% apply bid ordered today.

## 2020-01-23 ENCOUNTER — Encounter: Admit: 2020-01-23 | Payer: MEDICARE | Primary: Internal Medicine

## 2020-01-23 DIAGNOSIS — N83202 Unspecified ovarian cyst, left side: Secondary | ICD-10-CM

## 2020-01-23 NOTE — Progress Notes
NL AV UT, ONE SM FIBROID NOTED. EMS 2.48mm. RT OV NL. LT OV HAS A 3.1CM CLEAR CYST. CDS CLEARImpression: Normal sized anteverted uterusNormal endometrial lining ( 2.1 mm) Normal right ovary; Left ovary with 3cm simple cyst - c/w cyst noted on MRI for Left hip painNo free fluid in cul de sacI have reviewed the Ultrasound photos and agree with the reportElectronically Signed by Daralene Milch, MD, January 23, 2020

## 2020-02-07 ENCOUNTER — Encounter
Admit: 2020-02-07 | Payer: PRIVATE HEALTH INSURANCE | Attending: Vascular and Interventional Radiology | Primary: Internal Medicine

## 2020-03-06 ENCOUNTER — Encounter
Admit: 2020-03-06 | Payer: PRIVATE HEALTH INSURANCE | Attending: Vascular and Interventional Radiology | Primary: Internal Medicine

## 2020-10-27 IMAGING — MG MAMMOGRAPHY SCREENING BILATERAL 3D TOMOSYNTHESIS WITH CAD
8 series · 8 of 24 positions shown · non-contrast
Comparison: 10/01/2019 and dating back to 10/08/2013

MAMMOGRAPHY SCREENING BILATERAL 3D TOMOSYNTHESIS WITH CAD, 10/27/2020 [DATE]: 
CLINICAL INDICATION: Screening exam.
TECHNIQUE: Digital bilateral mammograms and 3-D Tomosynthesis were obtained. 
These were interpreted both primarily and with the aid of computer-aided 
detection system. 
BREAST DENSITY: (Level C) The breasts are heterogeneously dense, which may 
obscure small masses.

[L CC]
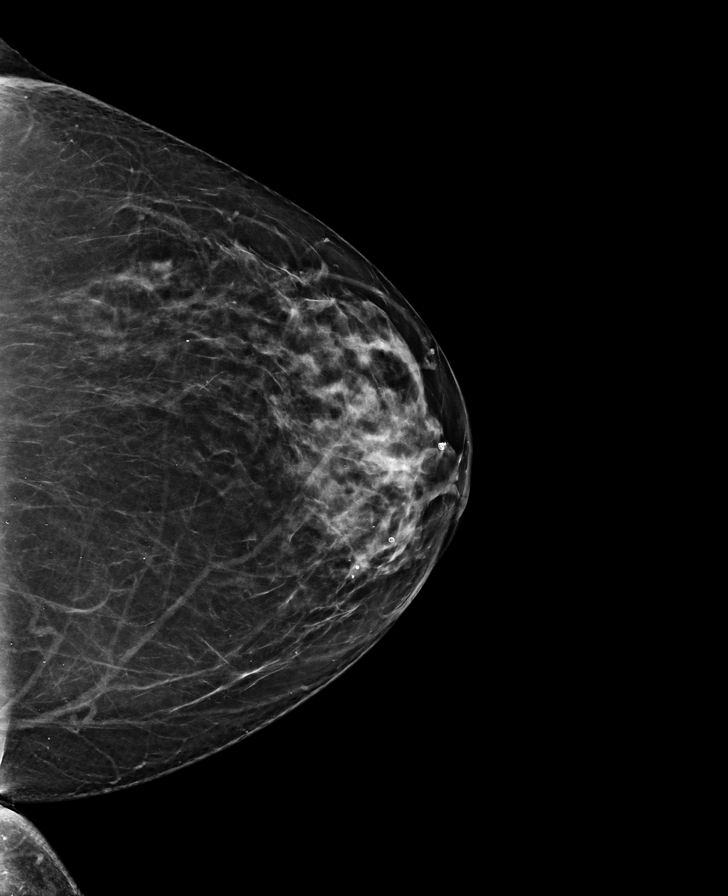

[R MLO]
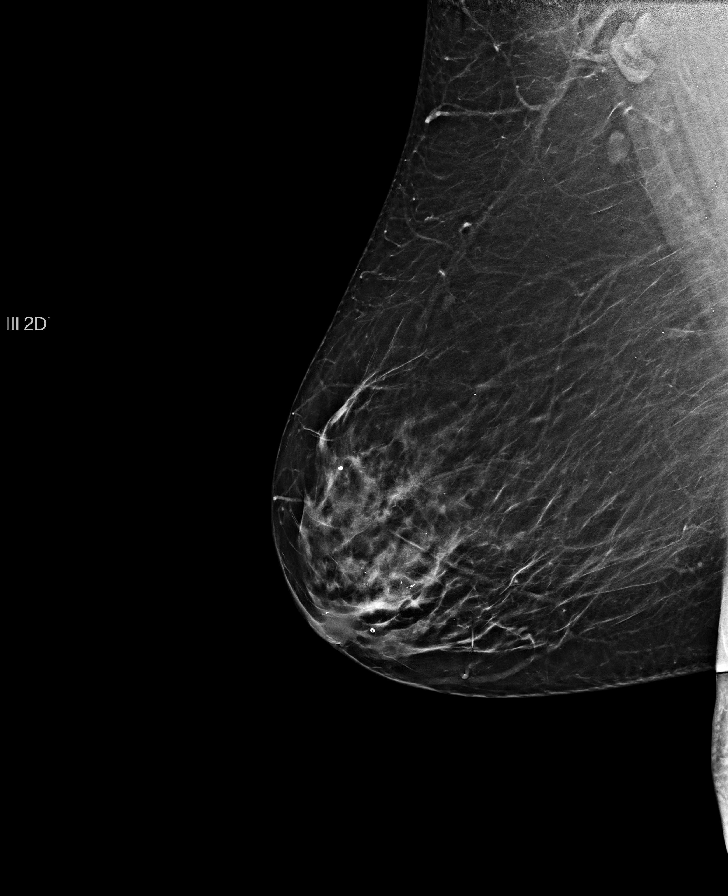

[L MLO]
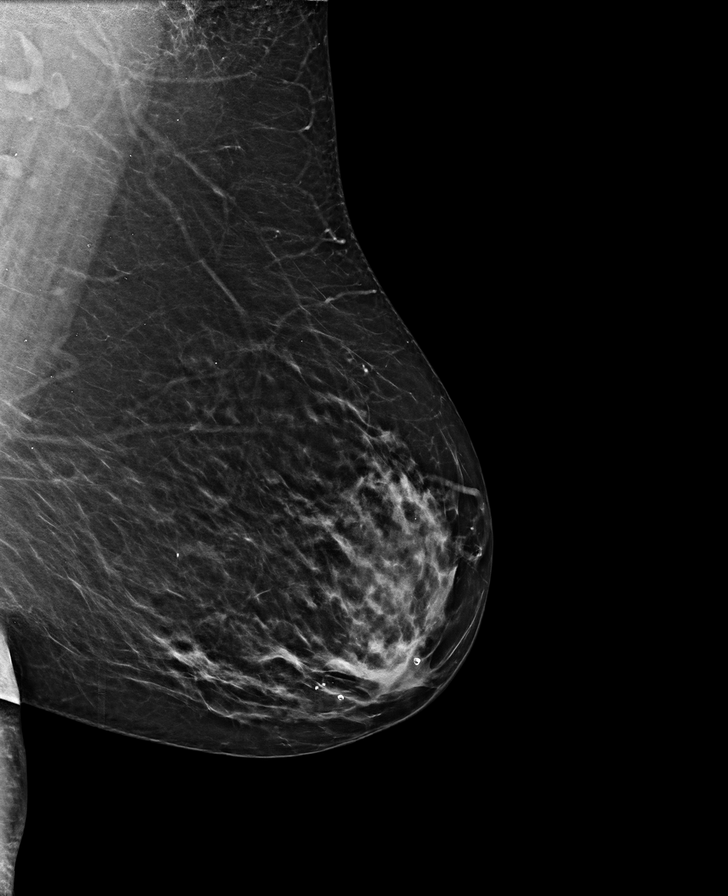

[R CC]
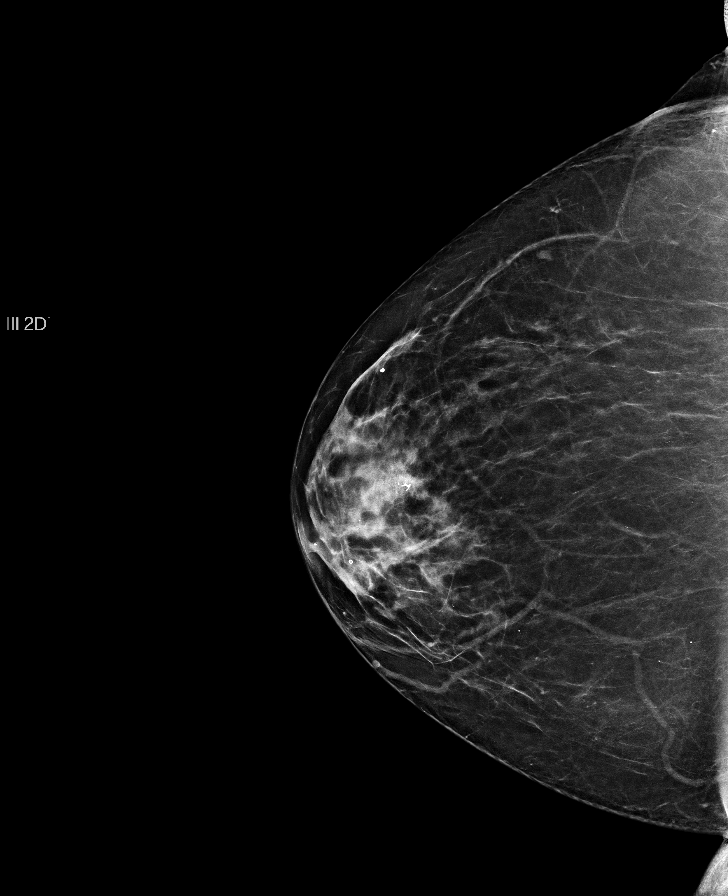

[R MLO tomo · tomo slice 43/84.0]
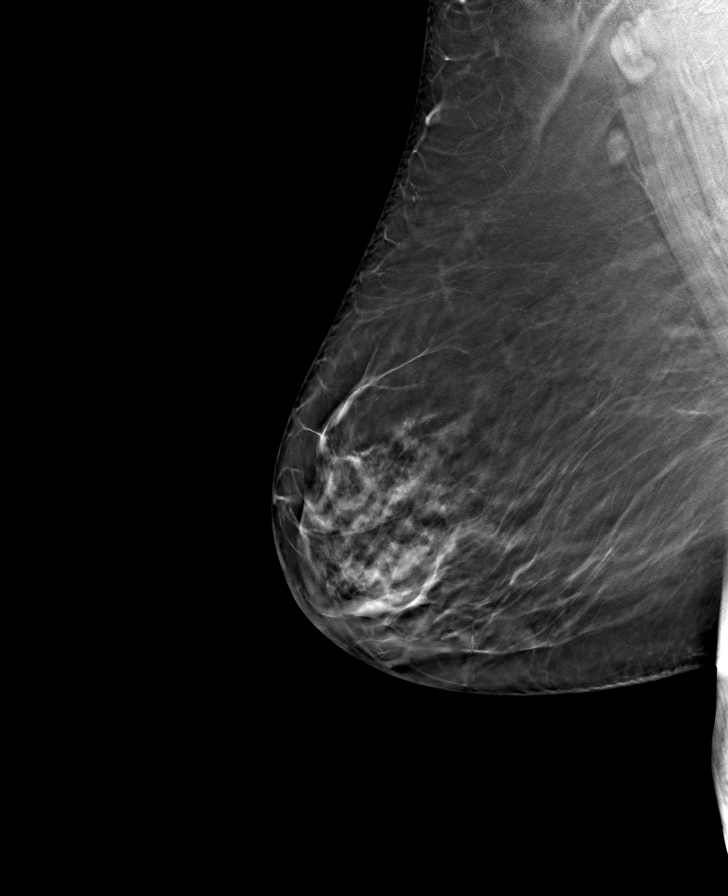

[R CC tomo · tomo slice 37/73.0]
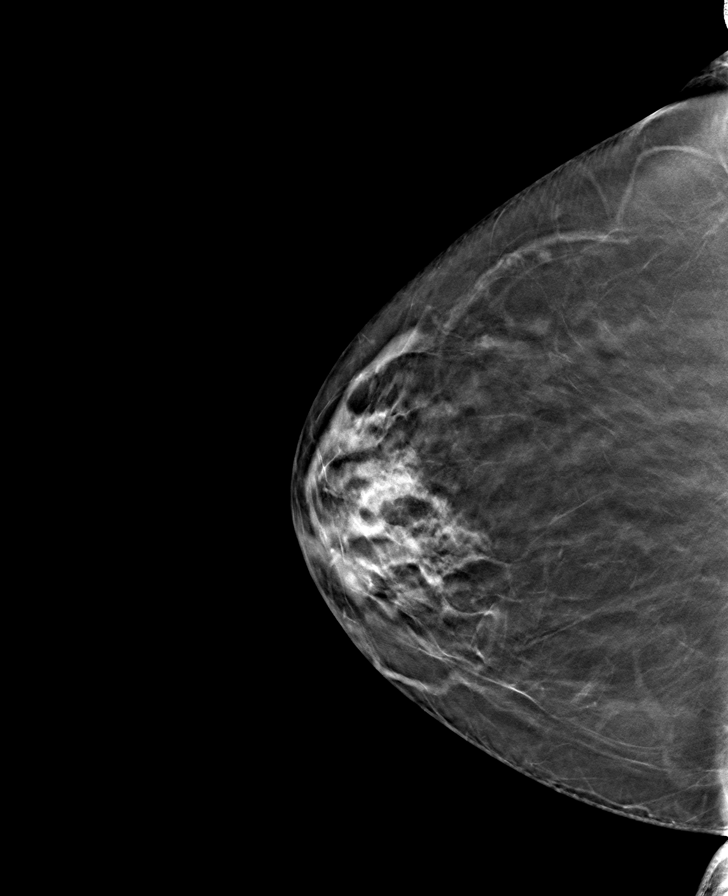

[L MLO tomo · tomo slice 41/80.0]
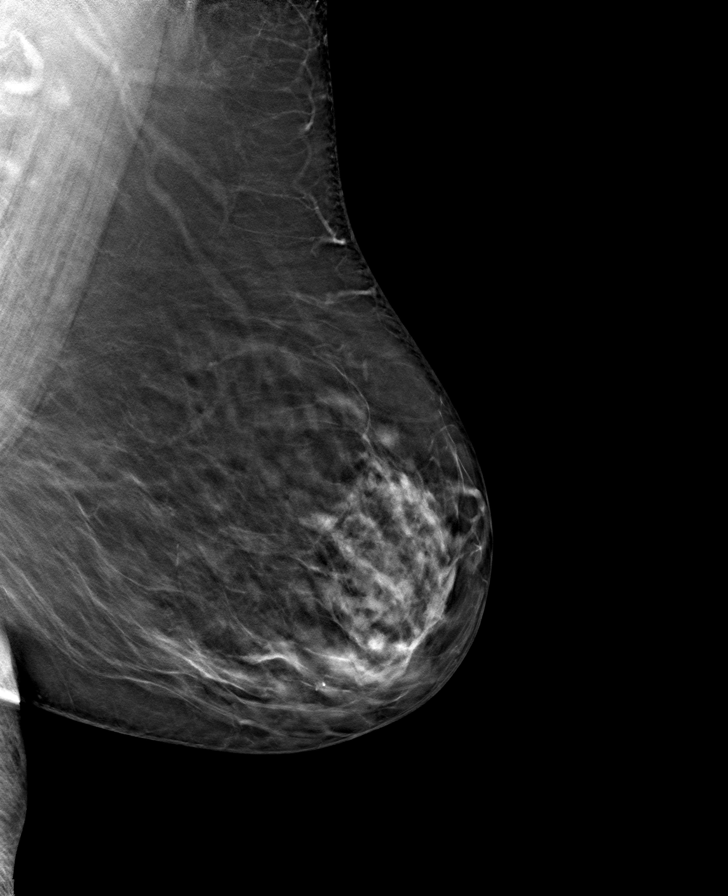

[L CC tomo · tomo slice 37/73.0]
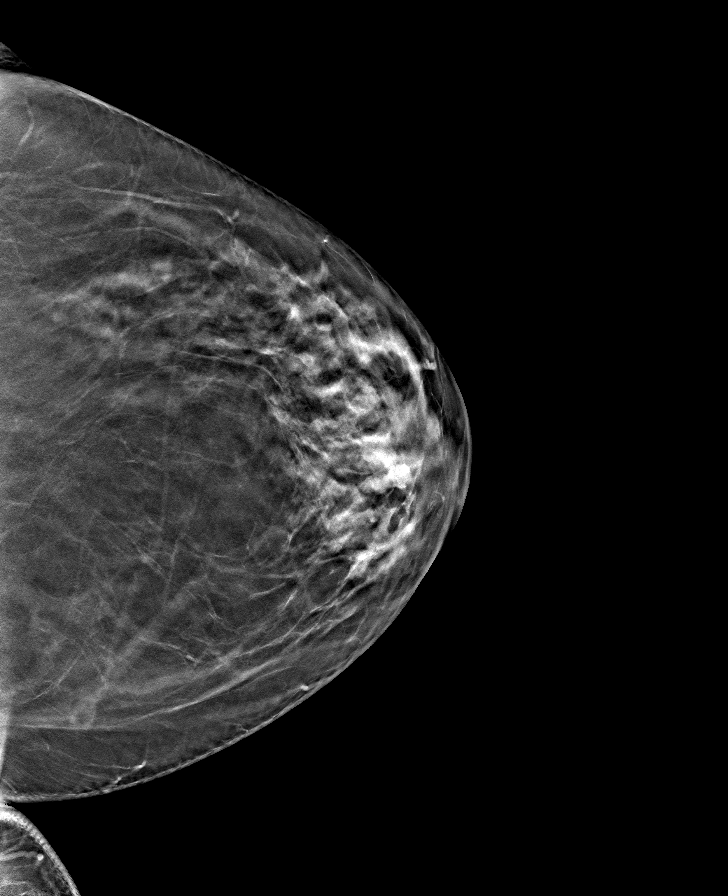

[8 of 24 positions shown; findings below may reference images not displayed]

FINDINGS: Overall stable mammographic appearance. No suspicious mass, 
calcifications, or area of architectural distortion in either breast.
IMPRESSION: No mammographic evidence of malignancy in either breast. 
( BI-RADS 2) Benign findings. Routine mammographic follow-up is recommended.

## 2020-10-30 ENCOUNTER — Encounter: Admit: 2020-10-30 | Payer: PRIVATE HEALTH INSURANCE | Primary: Internal Medicine

## 2020-11-17 ENCOUNTER — Encounter
Admit: 2020-11-17 | Payer: PRIVATE HEALTH INSURANCE | Attending: Vascular and Interventional Radiology | Primary: Internal Medicine

## 2020-11-20 ENCOUNTER — Encounter
Admit: 2020-11-20 | Payer: PRIVATE HEALTH INSURANCE | Attending: Vascular and Interventional Radiology | Primary: Internal Medicine

## 2020-11-20 ENCOUNTER — Encounter: Admit: 2020-11-20 | Payer: PRIVATE HEALTH INSURANCE | Attending: Internal Medicine | Primary: Internal Medicine

## 2020-12-03 ENCOUNTER — Encounter
Admit: 2020-12-03 | Payer: PRIVATE HEALTH INSURANCE | Attending: Vascular and Interventional Radiology | Primary: Internal Medicine

## 2020-12-09 ENCOUNTER — Telehealth: Admit: 2020-12-09 | Payer: PRIVATE HEALTH INSURANCE | Attending: Internal Medicine | Primary: Internal Medicine

## 2020-12-09 ENCOUNTER — Encounter: Admit: 2020-12-09 | Payer: PRIVATE HEALTH INSURANCE | Primary: Internal Medicine

## 2020-12-09 DIAGNOSIS — Z800 Family history of malignant neoplasm of digestive organs: Secondary | ICD-10-CM

## 2020-12-09 NOTE — Telephone Encounter
Spoke w very pleasant pt who wishes to seek guidance about pancreatic cancer surveillance in setting of strong FHX of PDAC.  Requisitions placed for [facility] consult appt & [facility] clinic.  All pt questions answered to her satisfaction.

## 2021-01-06 ENCOUNTER — Encounter: Admit: 2021-01-06 | Payer: PRIVATE HEALTH INSURANCE | Attending: Internal Medicine | Primary: Internal Medicine

## 2021-01-06 DIAGNOSIS — Z01812 Encounter for preprocedural laboratory examination: Secondary | ICD-10-CM

## 2021-01-08 ENCOUNTER — Telehealth: Admit: 2021-01-08 | Payer: PRIVATE HEALTH INSURANCE | Primary: Internal Medicine

## 2021-01-08 NOTE — Telephone Encounter
[facility] IntakeFHQ:  completed by patientPersonal history of cancer:  NoPathology in EPIC?: no?	External pathology needed?: noGI ScreeningColonoscopy reports/pathology in EPIC?  Yes, colonoscopy on file under [facility] 08/11/16?	External pathology/procedure note needed?:  noCancer-related genetic testing?	Has patient had genetic testing for an inherited cancer risk?  No?	Patient relatives had genetic testing for an inherited cancer risk?  NoVirtual Appointment Review?	Virtual Appointment: N/A

## 2021-02-16 ENCOUNTER — Ambulatory Visit: Admit: 2021-02-16 | Payer: MEDICARE | Attending: Internal Medicine | Primary: Internal Medicine

## 2021-02-16 ENCOUNTER — Inpatient Hospital Stay: Admit: 2021-02-16 | Discharge: 2021-02-16 | Payer: MEDICARE | Primary: Internal Medicine

## 2021-02-16 DIAGNOSIS — M353 Polymyalgia rheumatica: Secondary | ICD-10-CM

## 2021-02-16 DIAGNOSIS — H8110 Benign paroxysmal vertigo, unspecified ear: Secondary | ICD-10-CM

## 2021-02-16 DIAGNOSIS — E669 Obesity, unspecified: Secondary | ICD-10-CM

## 2021-02-16 DIAGNOSIS — Z01818 Encounter for other preprocedural examination: Secondary | ICD-10-CM

## 2021-02-16 DIAGNOSIS — R7303 Prediabetes: Secondary | ICD-10-CM

## 2021-02-16 LAB — COMPREHENSIVE METABOLIC PANEL
BKR A/G RATIO: 2 x 1000/??L (ref 1.0–2.2)
BKR ALANINE AMINOTRANSFERASE (ALT): 11 U/L (ref 10–35)
BKR ALBUMIN: 4.5 g/dL (ref 3.6–4.9)
BKR ALKALINE PHOSPHATASE: 64 U/L (ref 9–122)
BKR ANION GAP: 12 (ref 7–17)
BKR ASPARTATE AMINOTRANSFERASE (AST): 14 U/L (ref 10–35)
BKR AST/ALT RATIO: 1.3
BKR BILIRUBIN TOTAL: 0.5 mg/dL (ref 0.0–<=1.2)
BKR BLOOD UREA NITROGEN: 9 mg/dL (ref 8–23)
BKR BUN / CREAT RATIO: 12.9 (ref 8.0–23.0)
BKR CALCIUM: 9.7 mg/dL (ref 8.8–10.2)
BKR CHLORIDE: 103 mmol/L (ref 98–107)
BKR CO2: 25 mmol/L (ref 20–30)
BKR CREATININE: 0.7 mg/dL (ref 0.40–1.30)
BKR EGFR, CREATININE (CKD-EPI 2021): >60 mL/min/{1.73_m2} (ref >=60)
BKR GLOBULIN: 2.3 g/dL (ref 2.3–3.5)
BKR GLUCOSE: 98 mg/dL (ref 70–100)
BKR POTASSIUM: 4.3 mmol/L (ref 3.3–5.3)
BKR PROTEIN TOTAL: 6.8 g/dL (ref 6.6–8.7)
BKR SODIUM: 140 mmol/L (ref 136–144)
BKR WAM HEMOGLOBIN: 140 mmol/L (ref 136–144)
BKR WAM MONOCYTES: 6.8 g/dL (ref 6.6–8.7)

## 2021-02-16 LAB — LIPID PANEL
BKR ALBUMIN: 3.2 g/dL (ref 0.0–5.0)
BKR CHOLESTEROL/HDL RATIO: 3.2 (ref 0.0–5.0)
BKR CHOLESTEROL: 239 mg/dL — ABNORMAL HIGH (ref 8.8–10.2)
BKR HDL CHOLESTEROL: 74 mg/dL (ref >=40)
BKR LDL CHOLESTEROL SAMPSON CALCULATED: 148 mg/dL — ABNORMAL HIGH (ref <=1.2)
BKR TRIGLYCERIDES: 97 mg/dL

## 2021-02-16 LAB — CBC WITH AUTO DIFFERENTIAL
BKR WAM ABSOLUTE IMMATURE GRANULOCYTES.: 0.02 x 1000/??L (ref 0.00–0.30)
BKR WAM ABSOLUTE LYMPHOCYTE COUNT.: 1.85 x 1000/??L (ref 0.60–3.70)
BKR WAM ABSOLUTE NRBC (2 DEC): 0 x 1000/??L (ref 0.00–1.00)
BKR WAM ANALYZER ANC: 4.29 x 1000/??L (ref 2.00–7.60)
BKR WAM BASOPHIL ABSOLUTE COUNT.: 0.03 x 1000/??L (ref 0.00–1.00)
BKR WAM BASOPHILS: 0.4 % (ref 0.0–1.4)
BKR WAM EOSINOPHIL ABSOLUTE COUNT.: 0.29 x 1000/??L (ref 0.00–1.00)
BKR WAM EOSINOPHILS: 4.2 % (ref 0.0–5.0)
BKR WAM HEMATOCRIT (2 DEC): 40.9 % (ref 35.00–45.00)
BKR WAM IMMATURE GRANULOCYTES: 0.3 % (ref 0.0–1.0)
BKR WAM LYMPHOCYTES: 26.7 % (ref 17.0–50.0)
BKR WAM MCHC: 32.8 g/dL (ref 31.0–36.0)
BKR WAM MCV: 88.1 fL (ref 80.0–100.0)
BKR WAM MONOCYTE ABSOLUTE COUNT.: 0.46 x 1000/??L (ref 0.00–1.00)
BKR WAM MPV: 10.2 fL (ref 8.0–12.0)
BKR WAM NEUTROPHILS: 61.8 % (ref 39.0–72.0)
BKR WAM NUCLEATED RED BLOOD CELLS: 0 % (ref 0.0–1.0)
BKR WAM PLATELETS: 218 x1000/??L (ref 150–420)
BKR WAM RDW-CV: 14.3 % (ref 11.0–15.0)
BKR WAM RED BLOOD CELL COUNT.: 4.64 M/??L (ref 4.00–6.00)
BKR WAM WHITE BLOOD CELL COUNT: 6.9 x1000/??L (ref 4.0–11.0)

## 2021-02-16 MED ORDER — MECLIZINE 25 MG TABLET
25 | ORAL_TABLET | Freq: Three times a day (TID) | ORAL | 1 refills | 10.00000 days | Status: AC | PRN
Start: 2021-02-16 — End: ?

## 2021-02-18 ENCOUNTER — Encounter: Admit: 2021-02-18 | Payer: MEDICARE | Primary: Internal Medicine

## 2021-02-18 ENCOUNTER — Ambulatory Visit: Admit: 2021-02-18 | Payer: MEDICARE | Primary: Internal Medicine

## 2021-02-18 ENCOUNTER — Encounter: Admit: 2021-02-18 | Payer: PRIVATE HEALTH INSURANCE | Primary: Internal Medicine

## 2021-02-18 DIAGNOSIS — IMO0001 Elevated BP: Secondary | ICD-10-CM

## 2021-02-18 DIAGNOSIS — E669 Obesity, unspecified: Secondary | ICD-10-CM

## 2021-02-18 DIAGNOSIS — M353 Polymyalgia rheumatica: Secondary | ICD-10-CM

## 2021-02-18 DIAGNOSIS — H01009 Unspecified blepharitis unspecified eye, unspecified eyelid: Secondary | ICD-10-CM

## 2021-02-18 DIAGNOSIS — S42291A Other displaced fracture of upper end of right humerus, initial encounter for closed fracture: Secondary | ICD-10-CM

## 2021-02-18 DIAGNOSIS — G5600 Carpal tunnel syndrome, unspecified upper limb: Secondary | ICD-10-CM

## 2021-02-18 DIAGNOSIS — L719 Rosacea, unspecified: Secondary | ICD-10-CM

## 2021-02-18 DIAGNOSIS — N83202 Unspecified ovarian cyst, left side: Secondary | ICD-10-CM

## 2021-02-18 DIAGNOSIS — F419 Anxiety disorder, unspecified: Secondary | ICD-10-CM

## 2021-02-18 DIAGNOSIS — N904 Leukoplakia of vulva: Secondary | ICD-10-CM

## 2021-02-18 DIAGNOSIS — K648 Other hemorrhoids: Secondary | ICD-10-CM

## 2021-02-18 DIAGNOSIS — R7303 Prediabetes: Secondary | ICD-10-CM

## 2021-02-18 DIAGNOSIS — N952 Postmenopausal atrophic vaginitis: Secondary | ICD-10-CM

## 2021-02-18 DIAGNOSIS — H3530 Unspecified macular degeneration: Secondary | ICD-10-CM

## 2021-02-18 DIAGNOSIS — J45909 Unspecified asthma, uncomplicated: Secondary | ICD-10-CM

## 2021-02-18 MED ORDER — CLOBETASOL 0.05 % TOPICAL CREAM
0.05 % | TOPICAL | 5 refills | Status: AC
Start: 2021-02-18 — End: ?

## 2021-02-18 NOTE — Progress Notes
HPI:   Erin Walker is a 70 y.o. female who presents to this practice with recent flare of vulvar pruritis  - pt has bx proven lichen sclerosis  - she restarted temovate and is getting relief. She also had incidental finding of 3 cm cyst of left ovary; confirmed on UD 2021No LMP recorded. Patient is postmenopausal.Patient History?	 Problem List: has Obesity, Class II, BMI 35-39.9; Atrophic vaginitis; Knee pain; Skin lesion of scalp; Elevated BP; Prediabetes; Lichen sclerosus of female genitalia; Well woman exam with routine gynecological exam; Macular degeneration, right eye; Reactive airway disease without asthma; Polymyalgia rheumatica (HC Code) (HC CODE) (HC Code); Tachycardia; Current chronic use of systemic steroids; Cyst of left ovary; and Ocular pain, right eye on their problem list. ?	Past Surgical History: has a past surgical history that includes Ovarian cyst removal; Adenoidectomy; Tonsillectomy; Hysteroscopy; Dilation and curettage of uterus; ERCP; Cholecystectomy, laparoscopic; Carpal tunnel release (Bilateral); and Vertical Banded Gastroplasty.?	Past Medical History: has a past medical history of Anxiety disorder, Asthma, Blepharitis with rosacea, Closed 4-part fracture of proximal humerus, right, initial encounter (03/19/2019), CTS (carpal tunnel syndrome), Elevated BP (10/22/2013), Internal hemorrhoids, Macular degeneration, Obesity, Polymyalgia rheumatica (HC Code) (HC CODE) (HC Code), Prediabetes, and Rosacea.?	Family History: family history includes Breast cancer (age of onset: 87) in her mother; Cancer in her brother and father; Heart disease in her sister; Hypertension in her sister; Kidney disease in her sister; No Known Problems in her brother and sister; Pancreatic cancer in her maternal grandfather, maternal uncle, and mother.?	Allergies:is allergic to celebrex [celecoxib], codeine, nsaids (non-steroidal anti-inflammatory drug), and adhesive tape-silicones. ?	Medications: ?	Current Outpatient Medications: ?	?  biotin, 1,000 mcg, Oral, QAM?	?  cholecalciferol (vitamin D3), 1,000 Units, Oral, QAM?	?  clobetasoL, Apply topically once a week.?	?  meclizine, 25 mg, Oral, TID PRN?	?  vit C/E/Zn/coppr/lutein/zeaxan (PRESERVISION AREDS-2 ORAL), 1 tablet, Oral, BID ?	Social History: reports that she quit smoking about 25 years ago. She has never used smokeless tobacco. She reports previous alcohol use. She reports that she does not use drugs. ?	Menstrual/Sexual History: pm OB History Gravida Para Term Preterm AB Living 1       1   SAB IAB Ectopic Molar Multiple Live Births              # Outcome Date GA Lbr Len/2nd Weight Sex Delivery Anes PTL Lv 1 AB          Review of Systems Genitourinary: Positive for genital sores. Negative for vaginal bleeding and vaginal discharge. All other systems reviewed and are negative.  Objective:  BP (!) 140/78  - Temp (!) 95.9 ?F (35.5 ?C) (Temporal)  - Ht 5' 7 (1.702 m)  - Wt 102.1 kg  - BMI 35.24 kg/m?  (225 lbs.)Physical ExamConstitutional:     Appearance: Normal appearance. Genitourinary:    Moderate vaginal atrophy present.   Adnexa exam comments: Adnexa non palpable  Habitus limits exam.    Uterus exam comments: Nl sized and mobile. Neurological:    Mental Status: She is alert. Vitals and nursing note reviewed. Exam conducted with a chaperone present.   Assessment / Plan:  Lichen sclerosis flare  - uses temovate bid until subsides the maintenance 1-2 x per weekLeft Ovarian cyst  - remains stable at 3cm  - films reviewed w pt   No further [place] needed unless painPatient examination required medical chaperone. The sensitive parts of the examination or treatment were performed with a chaperone present: Mannie Stabile, MA

## 2021-02-18 NOTE — Progress Notes
TV US FOR LO CYSTAV UTEMS 2.37mmLT OV HAS A KNOWN 3CM CLEAR CYST, NO CHANGERT OV NOT VIZ, ADNEXA CLEARCDS CLEARImpression:Anteverted uterus with thin endometrial lining ( 2.52mm)Normal right adnexaLeft adnexa with stable 3cm simple cyst  - unchanged from 2021 studyNo free fluid in cul de sacI was present for the examI have reviewed the Ultrasound photos and agree with the reportElectronically Signed by Daralene Milch, MD, February 18, 2021

## 2021-02-19 ENCOUNTER — Encounter
Admit: 2021-02-19 | Payer: PRIVATE HEALTH INSURANCE | Attending: Vascular and Interventional Radiology | Primary: Internal Medicine

## 2021-02-21 ENCOUNTER — Ambulatory Visit: Admit: 2021-02-21 | Payer: MEDICARE | Primary: Internal Medicine

## 2021-02-22 DIAGNOSIS — Z01812 Encounter for preprocedural laboratory examination: Secondary | ICD-10-CM

## 2021-02-22 DIAGNOSIS — Z20822 Contact with and (suspected) exposure to covid-19: Secondary | ICD-10-CM

## 2021-02-22 LAB — COVID-19 CLEARANCE OR FOR PLACEMENT ONLY
BKR SARS-COV-2 RNA (COVID-19) (YH): NEGATIVE
BKR WAM MCH (PG): NEGATIVE pg (ref 27.0–33.0)

## 2021-02-23 ENCOUNTER — Encounter: Admit: 2021-02-23 | Payer: PRIVATE HEALTH INSURANCE | Primary: Internal Medicine

## 2021-02-23 ENCOUNTER — Ambulatory Visit: Admit: 2021-02-23 | Payer: MEDICARE | Attending: Anesthesiology | Primary: Internal Medicine

## 2021-02-23 ENCOUNTER — Inpatient Hospital Stay: Admit: 2021-02-23 | Discharge: 2021-02-23 | Payer: MEDICARE | Primary: Internal Medicine

## 2021-02-23 DIAGNOSIS — J45909 Unspecified asthma, uncomplicated: Secondary | ICD-10-CM

## 2021-02-23 DIAGNOSIS — H01009 Unspecified blepharitis unspecified eye, unspecified eyelid: Secondary | ICD-10-CM

## 2021-02-23 DIAGNOSIS — E669 Obesity, unspecified: Secondary | ICD-10-CM

## 2021-02-23 DIAGNOSIS — Z87891 Personal history of nicotine dependence: Secondary | ICD-10-CM

## 2021-02-23 DIAGNOSIS — L719 Rosacea, unspecified: Secondary | ICD-10-CM

## 2021-02-23 DIAGNOSIS — F419 Anxiety disorder, unspecified: Secondary | ICD-10-CM

## 2021-02-23 DIAGNOSIS — H539 Unspecified visual disturbance: Secondary | ICD-10-CM

## 2021-02-23 DIAGNOSIS — M797 Fibromyalgia: Secondary | ICD-10-CM

## 2021-02-23 DIAGNOSIS — H3530 Unspecified macular degeneration: Secondary | ICD-10-CM

## 2021-02-23 DIAGNOSIS — T753XXA Motion sickness, initial encounter: Secondary | ICD-10-CM

## 2021-02-23 DIAGNOSIS — Z885 Allergy status to narcotic agent status: Secondary | ICD-10-CM

## 2021-02-23 DIAGNOSIS — M353 Polymyalgia rheumatica: Secondary | ICD-10-CM

## 2021-02-23 DIAGNOSIS — Z9049 Acquired absence of other specified parts of digestive tract: Secondary | ICD-10-CM

## 2021-02-23 DIAGNOSIS — H532 Diplopia: Secondary | ICD-10-CM

## 2021-02-23 DIAGNOSIS — Z6835 Body mass index (BMI) 35.0-35.9, adult: Secondary | ICD-10-CM

## 2021-02-23 DIAGNOSIS — Z886 Allergy status to analgesic agent status: Secondary | ICD-10-CM

## 2021-02-23 DIAGNOSIS — R7303 Prediabetes: Secondary | ICD-10-CM

## 2021-02-23 DIAGNOSIS — Z9089 Acquired absence of other organs: Secondary | ICD-10-CM

## 2021-02-23 DIAGNOSIS — R42 Dizziness and giddiness: Secondary | ICD-10-CM

## 2021-02-23 DIAGNOSIS — K648 Other hemorrhoids: Secondary | ICD-10-CM

## 2021-02-23 DIAGNOSIS — I10 Essential (primary) hypertension: Secondary | ICD-10-CM

## 2021-02-23 DIAGNOSIS — Z79899 Other long term (current) drug therapy: Secondary | ICD-10-CM

## 2021-02-23 DIAGNOSIS — IMO0001 Elevated BP: Secondary | ICD-10-CM

## 2021-02-23 DIAGNOSIS — Z96611 Presence of right artificial shoulder joint: Secondary | ICD-10-CM

## 2021-02-23 DIAGNOSIS — Z91048 Other nonmedicinal substance allergy status: Secondary | ICD-10-CM

## 2021-02-23 DIAGNOSIS — H5711 Ocular pain, right eye: Secondary | ICD-10-CM

## 2021-02-23 DIAGNOSIS — G5600 Carpal tunnel syndrome, unspecified upper limb: Secondary | ICD-10-CM

## 2021-02-23 DIAGNOSIS — S42291A Other displaced fracture of upper end of right humerus, initial encounter for closed fracture: Secondary | ICD-10-CM

## 2021-02-23 MED ORDER — FENTANYL (PF) 50 MCG/ML INJECTION SOLUTION
50 mcg/mL | Status: CP
Start: 2021-02-23 — End: ?

## 2021-02-23 MED ORDER — DIPHENHYDRAMINE 50 MG/ML INJECTION (WRAPPED E-RX)
50 mg/mL | INTRAVENOUS | Status: DC | PRN
Start: 2021-02-23 — End: 2021-02-28

## 2021-02-23 MED ORDER — PROPOFOL 10 MG/ML INTRAVENOUS EMULSION
10 mg/mL | INTRAVENOUS | Status: DC | PRN
Start: 2021-02-23 — End: 2021-02-23
  Administered 2021-02-23: 18:00:00 10 mL/h via INTRAVENOUS

## 2021-02-23 MED ORDER — LACTATED RINGERS INTRAVENOUS SOLUTION
INTRAVENOUS | Status: DC
Start: 2021-02-23 — End: 2021-02-28

## 2021-02-23 MED ORDER — PROPOFOL 10 MG/ML INTRAVENOUS EMULSION
10 mg/mL | INTRAVENOUS | Status: DC | PRN
Start: 2021-02-23 — End: 2021-02-23
  Administered 2021-02-23 (×2): 10 mg/mL via INTRAVENOUS

## 2021-02-23 MED ORDER — CALCIUM CARBONATE 500 MG-VITAMIN D3 10 MCG (400 UNIT) TABLET
1250 mg (500 mg calcium) - 400 unit | Freq: Every day | ORAL | Status: AC
Start: 2021-02-23 — End: ?

## 2021-02-23 MED ORDER — MIDAZOLAM 1 MG/ML INJECTION SOLUTION
1 mg/mL | Status: CP
Start: 2021-02-23 — End: ?

## 2021-02-23 MED ORDER — SODIUM CHLORIDE 0.9 % (FLUSH) INJECTION SYRINGE
0.9 % | INTRAVENOUS | Status: DC | PRN
Start: 2021-02-23 — End: 2021-02-28

## 2021-02-23 MED ORDER — MIDAZOLAM (PF) 1 MG/ML INJECTION SOLUTION
1 mg/mL | INTRAVENOUS | Status: DC | PRN
Start: 2021-02-23 — End: 2021-02-23
  Administered 2021-02-23: 17:00:00 1 mg/mL via INTRAVENOUS

## 2021-02-23 MED ORDER — GADOTERATE MEGLUMINE 0.5 MMOL/ML (376.9 MG/ML) INTRAVENOUS SOLUTION
0.5 mmol/mL (376.9 mg/mL) | Freq: Once | INTRAVENOUS | Status: CP | PRN
Start: 2021-02-23 — End: ?
  Administered 2021-02-23: 19:00:00 0.5 mL via INTRAVENOUS

## 2021-02-23 MED ORDER — ACETAMINOPHEN 325 MG TABLET
325 mg | ORAL | Status: DC | PRN
Start: 2021-02-23 — End: 2021-02-28

## 2021-02-23 MED ORDER — FENTANYL (PF) 50 MCG/ML INJECTION SOLUTION
50 mcg/mL | INTRAVENOUS | Status: DC | PRN
Start: 2021-02-23 — End: 2021-02-28

## 2021-02-23 MED ORDER — LACTATED RINGERS INTRAVENOUS SOLUTION
INTRAVENOUS | Status: DC | PRN
Start: 2021-02-23 — End: 2021-02-23
  Administered 2021-02-23: 17:00:00 via INTRAVENOUS

## 2021-02-23 MED ORDER — ONDANSETRON HCL (PF) 4 MG/2 ML INJECTION SOLUTION
4 mg/2 mL | INTRAVENOUS | Status: DC | PRN
Start: 2021-02-23 — End: 2021-02-28

## 2021-02-23 NOTE — Other
Patient arrived from MRI to recovery asleep on simple face mask. Patient drinking water without issue. Discharge instructions discussed, patient verbalized understanding. Patient discharged home via car accompanied by husband.

## 2021-02-23 NOTE — Other
Operative Diagnosis:Pre-op:   * No pre-op diagnosis entered * Patient Coded Diagnosis   None  Patient Diagnosis   None    * No Diagnosis Codes entered *Operative Procedure(s) :* No procedures listed *Post-op Procedure & Diagnosis ConfirmationPost-op Diagnosis: Post-op Diagnosis updated (see notes)     - Transient vision lossPost-op Procedure: Post-op Procedure updated (see notes)     - MRI brain with and without contrast

## 2021-02-23 NOTE — Anesthesia Pre-Procedure Evaluation
This is a 70 y.o. female scheduled for MRI BRAIN W WO IV CONTRAST.Review of Systems/ Medical HistoryPatient summary, nursing notes, EKG/Cardiac Studies  and Labs reviewed.Anesthesia Evaluation: Estimated body mass index is 35.32 kg/m? as calculated from the following:  Height as of this encounter: 5' 7 (1.702 m).  Weight as of this encounter: 102.3 kg. CC/HPI: 70 yo F with Anxiety, Asthma, Blepharitis with rosacea, HTN, Macular degeneration, Polymyalgia rheumatica, Prediabetes, and vertigo now scheduled for MRI brain due to ocular pain and visual disturbancePast Surgical History:  Past Surgical History:No date: ADENOIDECTOMY    Comment:  childNo date: CARPAL TUNNEL RELEASE; Bilateral2014: CHOLECYSTECTOMY, LAPAROSCOPICNo date: DILATION AND CURETTAGE OF UTERUSNo date: ERCPNo date: HYSTEROSCOPY1970: OVARIAN CYST REMOVALNo date: TONSILLECTOMY    Comment:  child2020: TOTAL SHOULDER REPLACEMENT; Right    Comment:  reverse2007: VERTICAL BANDED GASTROPLASTYCardiovascular:-Other Cardiovascular:  EKG NSR. Gastrointestinal/Genitourinary: -Nutritional Disorders: Patient has has increased body weightPhysical ExamCardiovascular:    normal exam  Rhythm: regularHeart Sounds: S1 present and S2 present.Pulmonary:  normal exam  Patient's breath sounds clear to auscultationAirway:  Mallampati: ITM distance: >3 FBNeck ROM: fullDental:  normal exam  Dentition: lower dentures and upper denturesAnesthesia PlanASA 3 The primary anesthesia plan is  general LMA. Anesthesia informed consent obtained. Consent obtained from: patientPlan discussed with CRNA.Anesthesiologist's Pre Op NoteI personally evaluated and examined the patient prior to the intra-operative phase of care.  (When Applicable) The preoperative evaluation was reviewed and the patient's status is unchanged.

## 2021-02-23 NOTE — Other
Post Anesthesia Transfer of Care NotePatient: Erin HandelProcedure(s) Performed: * No procedures listed * Patient location: PACU Last Vitals: Vitals Value Taken Time BP 138/76 02/23/21 1425 Temp 97 02/23/21 1427 Pulse 60 02/23/21 1426 Resp 13 02/23/21 1426 SpO2 99 % 02/23/21 1426 Vitals shown include unvalidated device data.Level of consciousness: awakeTransport Vital Signs:  Stable since the last set of recorded intra-operative vital signsIntra-operative Complications: noneIntra-operative Intake & Output and Antibiotics as per Anesthesia record and discussed with the RN.

## 2021-02-23 NOTE — Anesthesia Post-Procedure Evaluation
Anesthesia Post-op NotePatient: Erin HandelProcedure(s):  * No procedures listed * Patient location: PACULast Vitals:  I have noted the vital signs as listed in the nursing notes.Mental status recovered: patient participates in evaluation: YesVital signs reviewed: YesRespiratory function stable:YesAirway is patent: YesCardiovascular function and hydration status stable: YesPain control satisfactory: YesNausea and vomiting control satisfactory:YesNo complications documented.

## 2021-02-25 ENCOUNTER — Encounter
Admit: 2021-02-25 | Payer: PRIVATE HEALTH INSURANCE | Attending: Vascular and Interventional Radiology | Primary: Internal Medicine

## 2021-03-01 ENCOUNTER — Encounter: Admit: 2021-03-01 | Payer: PRIVATE HEALTH INSURANCE | Attending: Clinical Genetics (M.D.) | Primary: Internal Medicine

## 2021-03-01 ENCOUNTER — Ambulatory Visit: Admit: 2021-03-01 | Payer: MEDICARE | Primary: Internal Medicine

## 2021-03-01 ENCOUNTER — Inpatient Hospital Stay: Admit: 2021-03-01 | Discharge: 2021-03-01 | Payer: MEDICARE | Primary: Internal Medicine

## 2021-03-01 DIAGNOSIS — Z049 Encounter for examination and observation for unspecified reason: Secondary | ICD-10-CM

## 2021-03-01 DIAGNOSIS — Z800 Family history of malignant neoplasm of digestive organs: Secondary | ICD-10-CM

## 2021-03-03 NOTE — Progress Notes
[place] [facility] - Genetic ConsultationChief ComplaintI met with Erin Walker in the [employer] and Prevention Program in [facility] for a cancer genetics consultation.  Erin Walker was referred by Vickie Epley, MD due to her family history of pancreatic cancer.  We spent approximately 60 minutes together all of which was spent on genetic counseling and testing issues.  Please note that Erin Walker's husband, Gerlene Burdock, accompanied her to today's appointment and was present during our entire consultation.Relevant Clinical HistoryMs. Walker is currently 70 y.o..  Relevant medical history includes:?	Cancer Hx:  No cancer history?	Breast Hx: Most recent mammogram on 10/2020 - normal per patient, first at age 80 but she could not recall why; Ultrasound on 04/2015 due to dense breast tissue; receives clinical breast exams; Has never had a breast MRI or breast biopsy.?	Gyn Hx:  G1 P0, menarche 8, postmenopausal - LMP: age 69, uterus intact, partial right ovary removal in 1975 due to ovarian cyst that was painful, OCPs x2 years total, HRT used for <5 years total (Vagifem), fertility treatment never used, routine pelvic exam - normal per patient?	GI Hx:  most recent colonoscopy in 2013 - normal per patient, baseline @ 68-50 y/o, upper endoscopy never needed. Next colonoscopy scheduled for 08/11/2021.?	Derm Hx: Does not receive FBSE?	Former smoking hx 1 PPD for 25 yrs, quit in 1997, EtOH 1-2 drinks/weekFamily HistoryA three generation family history was reviewed at the time of Erin Walker's appointment today.  Relevant family history includes:Of note, Erin Walker did not report genetic testing in any of her relatives affected with cancer.Ancestry:  Maternal ancestry reported as [place] and paternal ancestry as [place].  There is no known Ashkenazi Jewish ancestry.  Risk Factors?	Maternal family history of pancreatic cancer in 3 relatives, including early-onset, in addition to postmenopausal breast and prostate cancerRisk Assessment?	Increased risk for a hereditary cancer syndrome?	The history is most suggestive of Hereditary Breast and Ovarian CancerGenetic CounselingBased on Erin Walker family history of breast, pancreatic and prostate cancer, we reviewed the spectrum of genes associated with hereditary cancer syndromes paying particular attention to:?	Hereditary breast and ovarian cancer - BRCA1 and BRCA2 genes.  We reviewed background information about the BRCA1 and BRCA2 genes including the increased lifetime risk of breast cancer (50-85%), ovarian cancer (15-60%), female breast cancer (5-10%), prostate cancer (up to 30%), pancreatic cancer (4-7%) and melanoma (slightly elevated). ?	Other hereditary pancreatic cancer genes including APC, ATM, CDKN2A, CDK4, PALB2, STK11, Lynch syndrome-associated genes (especially MLH1)?	Other hereditary breast cancer genes including ATM, CDH1, CHEK2, PALB2, PTEN, TP53We discussed Erin Walker maternal family history of breast, pancreatic and prostate cancer.  Approximately 8-10% of pancreatic cancers are due to hereditary causes. The majority of these hereditary pancreatic cancers are caused by Hereditary Breast and Ovarian Cancer syndrome (particularly BRCA2 mutations). We reviewed there are certain red flags for Hereditary Breast and Ovarian Cancer, such as having 3 relatives diagnosed with pancreatic cancer, early-onset (dx < age 82), and having multiple relatives with other HBOC-related cancers such as breast and prostate cancer on the same side of the family. However, there are also factors that may reduce the likelihood of a BRCA1/2 mutation, such as no reported family history of early-onset breast cancer (? age 105), ovarian cancer, female breast cancer and melanoma.  Additionally, we reviewed other hereditary pancreatic and breast cancer susceptibility genes, such as ATM, PALB2, STK11, given the maternal family history of pancreatic cancer and breast cancer.  Lastly, we discussed other hereditary causes of pancreatic cancer, including CDKN2A, APC, and Lynch syndrome-associated genes.  However, the likelihood for Lynch syndrome is reduced to the lack of colon cancer, uterine cancer and other gastrointestinal cancers seen in Lynch syndrome and CDKN2A may also be reduced by the lack of melanoma in the family history. We reviewed other hereditary breast cancer susceptibility genes, such as ATM, CDH1, CHEK2, PALB2, PTEN, TP53, given the family history of breast cancer.  Some of these genes are considered high-risk breast cancer genes associated with an estimated 50% or greater risk of breast cancer, while others are considered moderate risk breast cancer genes (ATM, CHEK2) conferring an estimated 20-40% risk of breast cancer.  We reviewed the possible management recommendation changes based on the identification of a mutation, as well as the inheritance pattern and implications for other family members.  We also discussed possible interpretations of a negative result. Erin Walker meets Hereditary Breast and Ovarian Cancer (HBOC) current NCCN guidelines for genetic testing, but does not meet her insurance's criteria for coverage of genetic testing.Genetic TestingWe discussed ideally we initiate genetic testing in a family member who is most likely to test positive, which in this case would be her affected relatives, but most are deceased. Her maternal uncle with prostate cancer is living, but Erin Walker felt strongly that he would not be interested in genetic counseling and testing at this time.  Therefore, we continued to discuss the options for genetic testing that are available to Erin Walker.  We discussed the limitations of interpreting an uninformative negative result when an affected relative in the family has not previously had genetic testing.  We discussed the following genetic testing options:1. Analysis of BRCA1 and BRCA2 alone2. [facility] Common Hereditary Cancers Panel testing, which analyzes 47 genes associated with hereditary breast, colon, uterine and ovarian cancer: APC, ATM, AXIN2, BARD1, BMPR1A, BRCA1, BRCA2, BRIP1, CDH1, CDK4, CDKN2A, CHEK2, CTNNA1, DICER1, EPCAM, GREM1, HOXB13, KIT, MEN1, MLH1, MSH2, MSH3, MSH6, MUTYH, NBN, NF1, NTHL1, PALB2, PDGFRA, PMS2, POLD1, POLE, PTEN, RAD50, RAD51C, RAD51D, SDHA, SDHB, SDHC, SDHD, SMAD4, SMARCA4, STK11, TP53, TSC1, TSC2, VHL.We reviewed the potential results of testing including a positive result (pathogenic mutation detected), a negative result (no mutation detected), and a variant of uncertain significance.  We discussed the laboratory will provide [place] with updated information should a variant be classified (i.e. if/when there is enough information to determine whether the variant is a true mutation or benign alteration), and we would plan to contact her in the event the variant(s) is reclassified.  Then, we went over the general risks, benefits, and limitations of testing at this time.  At the end of the visit, Erin Walker decided to proceed with the self-pay option at [facility] for the Common Hereditary Cancers Panel genetic test. She provided verbal consent for genetic testing and provided a blood specimen to be sent to [facility] for testing. She is aware [facility] will be in direct contact via email for payment of testing and her email address was confirmed. Erin Walker is aware genetic testing will not be initiated until after she has submitted payment to [facility]. I will contact Erin Walker by phone to disclose and discuss the results of her testing as soon as they become available. Should she test positive for a mutation, we would like her to return to clinic to discuss this in more detail.  Erin Walker did provide consent for her test results and medical records to be shared with all members of her family. Please see the consent form for additional details.Assessment and Plan1.	Based on Erin Walker's maternal family history  of pancreatic, breast and prostate cancer in multiple relatives, she is at increased risk for a hereditary cancer syndrome.  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	After reviewing available genetic testing options and not meeting her insurance criteria for genetic testing, she has chosen to proceed with the [facility] Common Hereditary Cancers panel testing for an out-of-pocket expense of $250.3.	Results will be available in the next 3-4 weeks and I will contact her by phone to review results when they become available.  In the meantime, she has my contact information and knows that she may contact me with questions or concerns.4.	Once results are available, they will be scanned to the media section in EPIC labeled 'Genetics Scan' and included in the summary letter.Electronically Signed by Brown Human, Licensed Genetic Counselor, September 26, 2022Yasmine [place], MS, [employer] Genetic Counselor [place] [facility]

## 2021-03-05 ENCOUNTER — Encounter
Admit: 2021-03-05 | Payer: PRIVATE HEALTH INSURANCE | Attending: Vascular and Interventional Radiology | Primary: Internal Medicine

## 2021-03-10 MED ORDER — ATORVASTATIN 10 MG TABLET
10 | ORAL_TABLET | Freq: Every day | ORAL | 1 refills | 90.00000 days | Status: AC
Start: 2021-03-10 — End: 2023-05-18

## 2021-03-23 ENCOUNTER — Telehealth: Admit: 2021-03-23 | Payer: PRIVATE HEALTH INSURANCE | Primary: Internal Medicine

## 2021-03-24 NOTE — Telephone Encounter
[place] [facility] - Genetic Test Results Phone NoteI spoke with Erin Walker today by phone to disclose and discuss the results of her genetic testing.  Please see her initial consult note from 03/01/2021 for a complete description of her personal and family history.  A copy of Erin Walker's genetic test results will be attached to this phone encounter.At the time of Erin Walker's appointment, she chose to pursue genetic testing based on her family history of pancreatic, prostate and breast cancer. After learning the risks, benefits, and limitations of testing, Erin Walker elected to undergo the [facility] Common Hereditary Cancers Panel testing, which analyzed 47 genes associated with hereditary breast, pancreatic, colon, uterine and ovarian cancer: APC, ATM, AXIN2, BARD1, BMPR1A, BRCA1, BRCA2, BRIP1, CDH1, CDK4, CDKN2A, CHEK2, CTNNA1, DICER1, EPCAM, GREM1, HOXB13, KIT, MEN1, MLH1, MSH2, MSH3, MSH6, MUTYH, NBN, NF1, NTHL1, PALB2, PDGFRA, PMS2, POLD1, POLE, PTEN, RAD50, RAD51C, RAD51D, SDHA, SDHB, SDHC, SDHD, SMAD4, SMARCA4, STK11, TP53, TSC1, TSC2, VHL.The results of Erin Walker's testing were negative for known, detectable mutations in any of the genes tested.Erin Walker was pleased to learn of her negative genetic test results.  We discussed that these test results significantly reduce the likelihood that she has a hereditary predisposition to breast, ovarian, uterine, colon, and other cancers.Possible explanations for her negative genetic test results include:1.	The most likely explanation is the cancers in the family are not hereditary, but are caused by a combination of factors, including environmental and lifestyle factors; or2.	There is a mutation in one of the genes evaluated that cannot be identified using current genetic testing technology; or3.	There is a mutation in a different gene associated with hereditary cancer that has not yet been identified or tested; or4.	Ms. Mable's relatives with cancer have a detectable mutation in one of the genes tested and she did not inherit this mutation.  We discussed one way of refining her future cancer risks would be for her maternal cousin with breast cancer and maternal cousin with prostate cancer to pursue genetic counseling and testing since they are the only living relatives affected with cancer from the maternal side of the family. Since the maternal family history of cancer remains strongly suggestive of an inherited cancer predisposition, it is strongly recommended that Ms. Compean's siblings, nieces, nephew and living maternal relatives have genetic counseling and testing. Erin Walker was encouraged to contact our office if relatives pursue genetic testing, so we can review their results of genetic testing and impact on Erin Walker's risk assessment and recommendations.[name] seemed to have a good understanding of her test results. We encouraged Erin Walker to share her results with her relatives.  While her negative genetic test results reduce the risk for her to have a hereditary predisposition to cancer, other family members may still wish to consider genetic counseling and/or testing, as noted above.  We would be happy to see members of Erin Walker's family in our program or provide them with contact information for a genetic counselor in their area, if they are interested in pursuing genetic counseling. Despite many recent advances in the treatment of pancreatic cancer, early diagnosis is still the most important way to improve overall survival by detecting pancreatic cancers at an earlier, treatable stage. Current research studies have focused on studying individuals at greatest risk for developing pancreatic cancer based on a family history of pancreatic cancer and/or an inherited pancreatic cancer risk. Therefore, screening may prove valuable in these individuals.  Based on her family history of pancreatic cancer,  Erin Walker AND her sister, may still have an increased risk to develop pancreatic cancer.  She is encouraged to review her family history and the benefits, risks, and limitations of pancreatic cancer screening with her gastroenterologist and other providers.  We strongly encourage you to discuss this information with your gastroenterologist and other providers, including continued care with Dr. Ferd Glassing in the [facility].Based on both personal and family history data available, Erin Walker's lifetime risk to develop breast cancer was calculated using IBIS Risk Evaluation software and is less than 20%.  Current national guidelines recommend women begin annual mammography screening by age 96 or ~10 years prior to the youngest breast cancer diagnosis in the family, but not before age 49 (whichever comes first).  We encourage her to discuss this information with her providers and continue with annual mammogram screening.  If she and her providers feel it would be helpful, we invite her to consult with the Breast Cancer Prevention Program to discuss and develop a personalized breast cancer screening and risk reduction plan. Appointments can be made by calling our office at [phone].In addition to routine cancer screenings and the recommendations discussed above, the American Cancer Society recommends individuals not use tobacco products, keep their alcohol intake under seven servings a week for women and 14 servings a week for men, engage in regular physical activity with a goal of at least 150 minutes per week of moderate intensity exercise (such as brisk walking), and eat a healthy diet with an emphasis on plant based foods and a goal of achieving a healthy weight.  Following these recommendations will help aid in overall health and may also help reduce cancer risks.Erin Walker was encouraged to contact Erin Walker at [phone] if there are any changes to her personal and/or family history of cancer, as this may impact the above risk assessment and recommendations.  All of [name]'s questions were answered at the time of our call today. I will also send her a summary letter along with a copy of her genetic test results, which will be available in the Letters tab.  She also has my contact information and knows to contact me with any questions or concerns.Electronically Signed by Brown Human, Licensed Genetic Counselor, October 18, 2022Yasmine [place], MS, GCLicensed Genetic CounselorSmilow Cancer [facility] and Prevention [facility]

## 2021-04-16 IMAGING — CT CT ABDOMEN AND PELVIS WITH CONTRAST
2 of 3 series · 16 of 46 positions shown, 18 images · IV contrast (APPLIED)
Comparison: 08/11/2015.

________________________________________________________________________________________________ 
CT ABDOMEN AND PELVIS WITH CONTRAST, 04/16/2021 [DATE]: 
A search for DICOM formatted images was conducted for prior CT imaging studies 
completed at a non-affiliated media free facility.   
CLINICAL INDICATION: Abdominal pelvic pain greatest on the left side
TECHNIQUE: The abdomen and pelvis was scanned from lung bases through the pubic 
rami with 100 mL of Isovue 300 on a high-resolution CT scanner using dose 
reduction techniques.  Routine MPR reconstructions were performed. The patients 
eGFR was calculated to be 70.9 mL/min/1.73 m2 using the i-STAT device.

[Series 4: axial · axial · 0.91mm/px · z∈[-424,-16]mm · 13 of 158 slices shown, 15 images]
[im 11/158  soft-tissue]
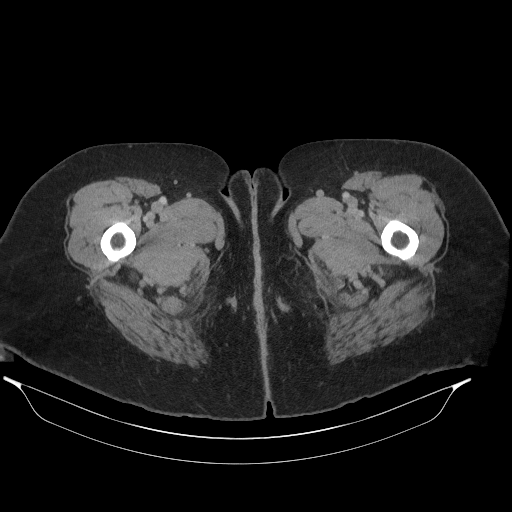
[im 11/158  bone]
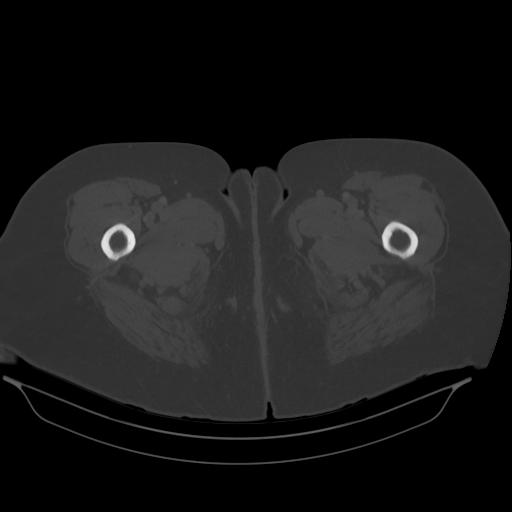
[im 21/158  soft-tissue]
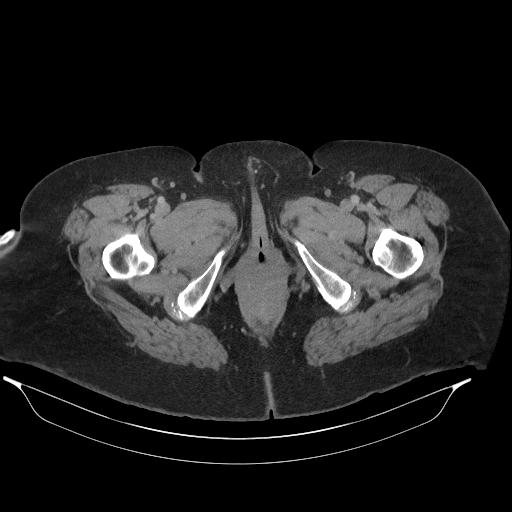
[im 31/158  soft-tissue]
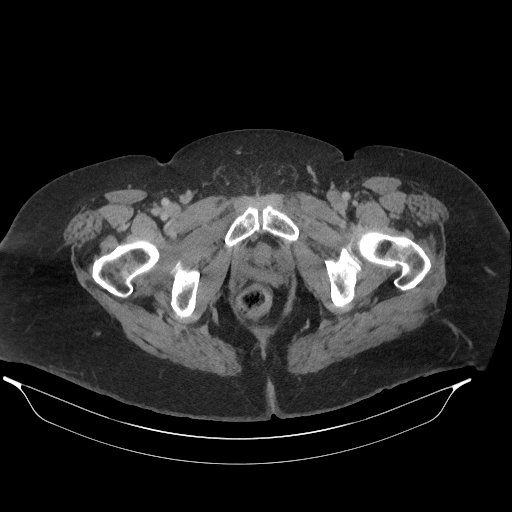
[im 46/158  soft-tissue]
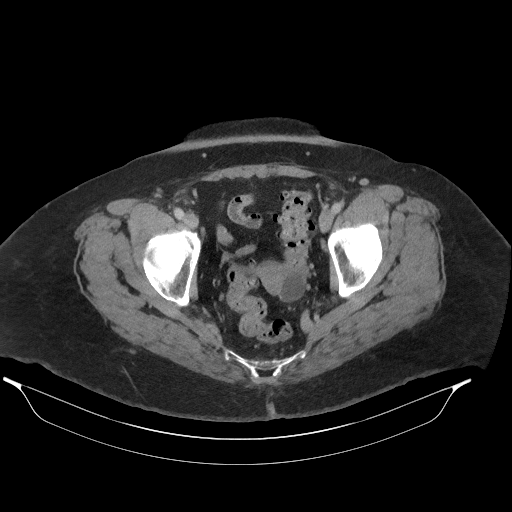
[im 56/158  soft-tissue]
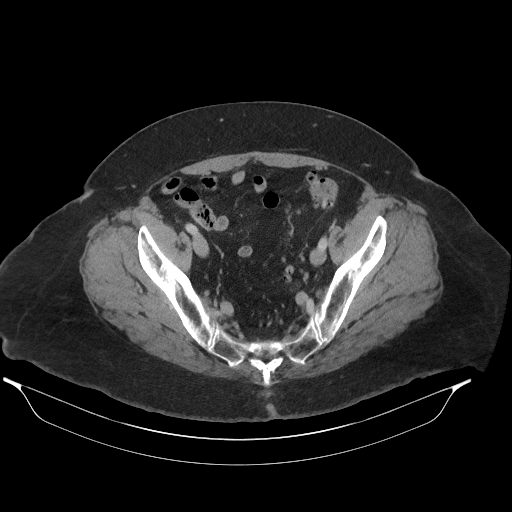
[im 66/158  soft-tissue]
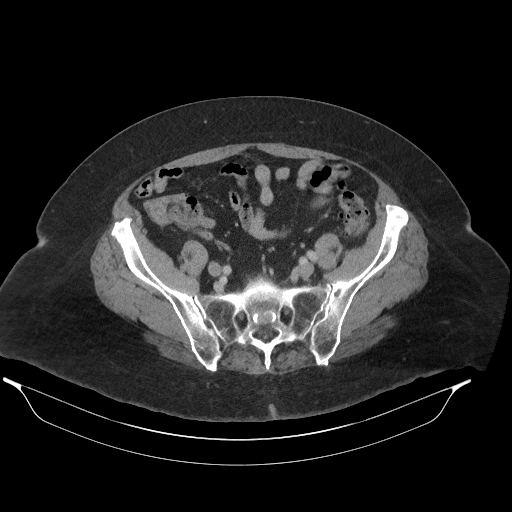
[im 82/158  soft-tissue]
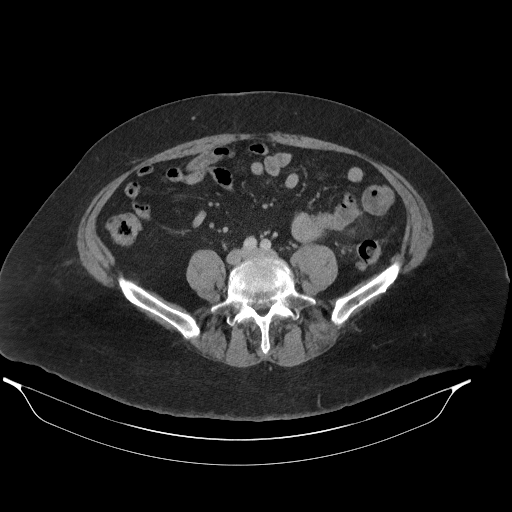
[im 92/158  soft-tissue]
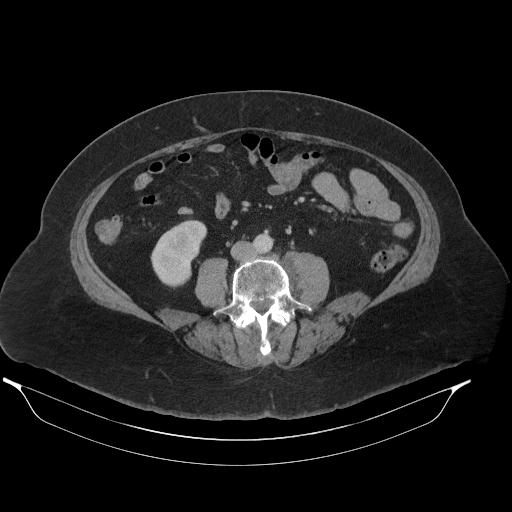
[im 102/158  soft-tissue]
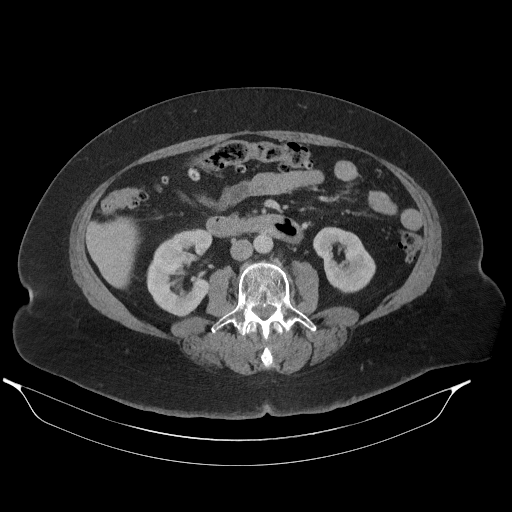
[im 102/158  bone]
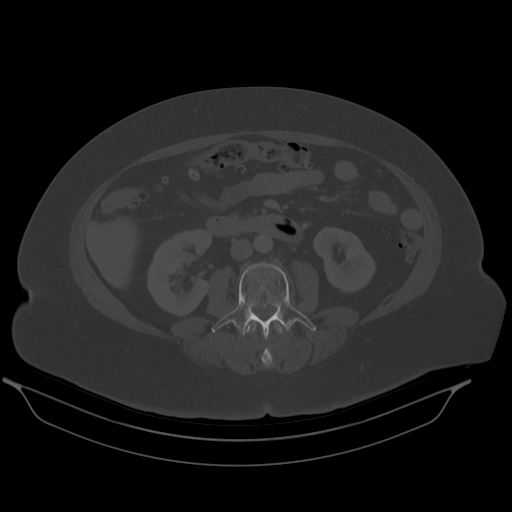
[im 112/158  soft-tissue]
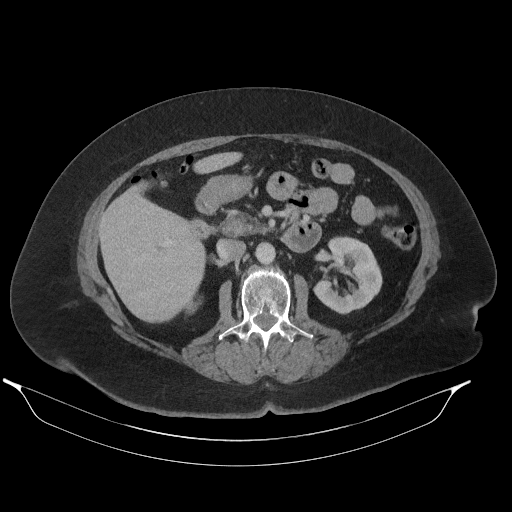
[im 127/158  soft-tissue]
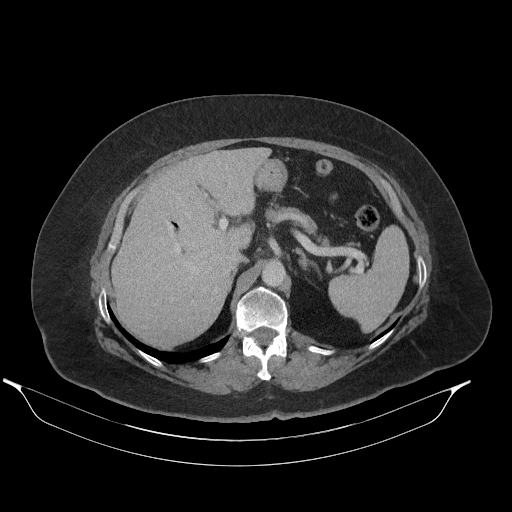
[im 137/158  soft-tissue]
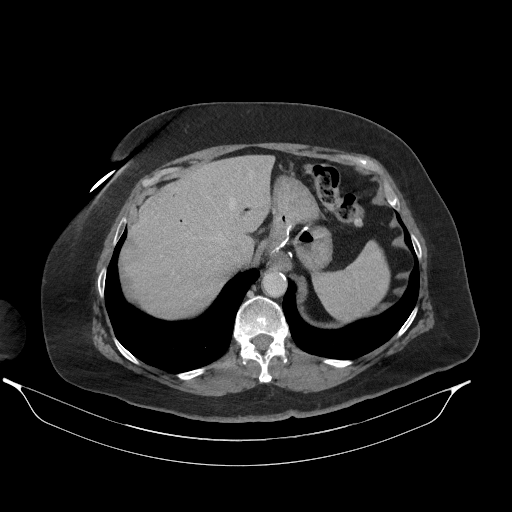
[im 147/158  soft-tissue]
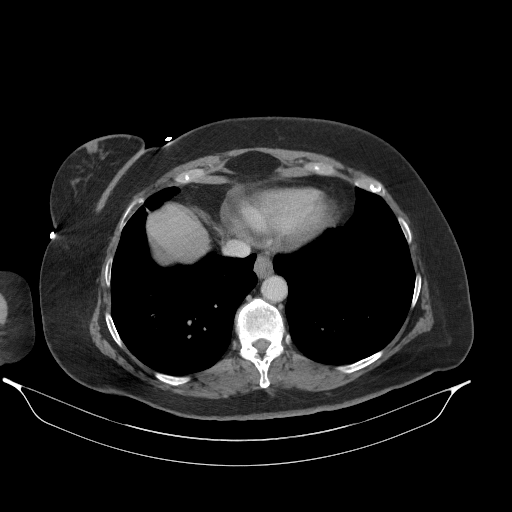

[Series 5: cor · coronal · 0.86mm/px · 3 of 142 slices shown]
[im 48/142  soft-tissue]
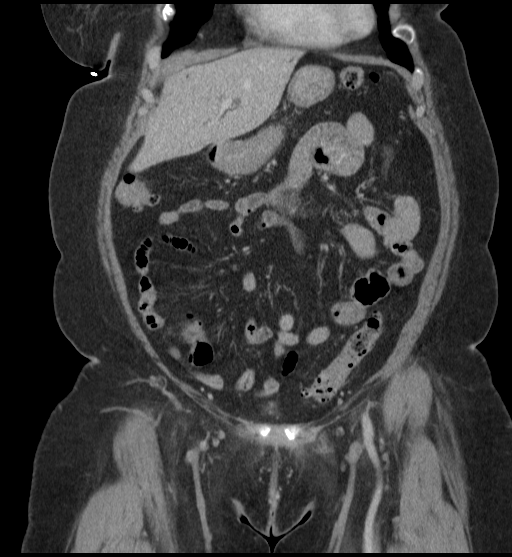
[im 63/142  soft-tissue]
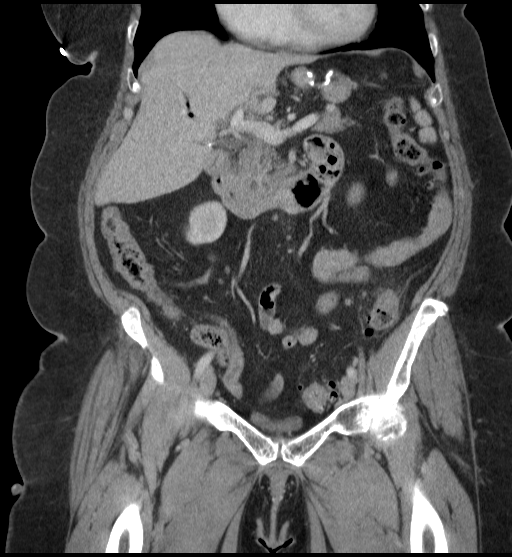
[im 79/142  soft-tissue]
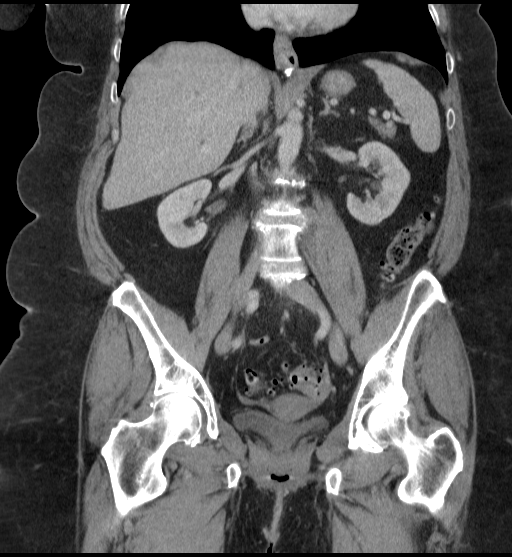

[16 of 46 positions shown; findings below may reference images not displayed]

FINDINGS: LUNG BASES: Lung bases are clear. No pleural effusions. 
HEPATOBILIARY: Pneumobilia. No mass. Mild prominence common duct most likely 
related to cholecystectomy.. Status post cholecystectomy. 
SPLEEN: Normal in size. 
PANCREAS: No ductal dilatation or mass.   
ADRENALS: No mass. 
GENITOURINARY: No enhancing mass or hydronephrosis.  F breast. 
LYMPH NODES: No adenopathy. 
STOMACH, SMALL BOWEL AND COLON: No bowel wall thickening or obstruction. 
Postoperative change gastric bypass. Scattered colonic diverticula with mild 
stranding the fat adjacent to the distal descending colon but no pericolonic 
collection.. Appendix is of caliber. 
VASCULAR STRUCTURES: No aneurysm.  
MUSCULOSKELETAL: No acute osseous abnormality. Scattered degenerative changes.  
ADDITIONAL FINDINGS: 2.7 cm cyst left ovary.
IMPRESSION: Colonic diverticula with stranding fat adjacent to diverticula in the distal 
descending colon but no pericolonic collection compilation of findings suggest 
uncomplicated diverticulitis. 
2.7 cm left ovarian cyst. 
Fatty liver. 
Pneumobilia and mild prominence the common duct most likely related to the 
previous cholecystectomy. 
RADIATION DOSE REDUCTION: All CT scans are performed using radiation dose 
reduction techniques, when applicable.  Technical factors are evaluated and 
adjusted to ensure appropriate moderation of exposure.  Automated dose 
management technology is applied to adjust the radiation doses to minimize 
exposure while achieving diagnostic quality images.

## 2021-06-02 IMAGING — DX HIP 1 VIEW LEFT
1 series · 1 of 1 positions shown · non-contrast
Comparison: 04/16/2021 CT pelvis

________________________________________________________________________________________________ 
HIP 1 VIEW LEFT, 06/02/2021 [DATE]: 
CLINICAL INDICATION: Left hip pain

[AP]
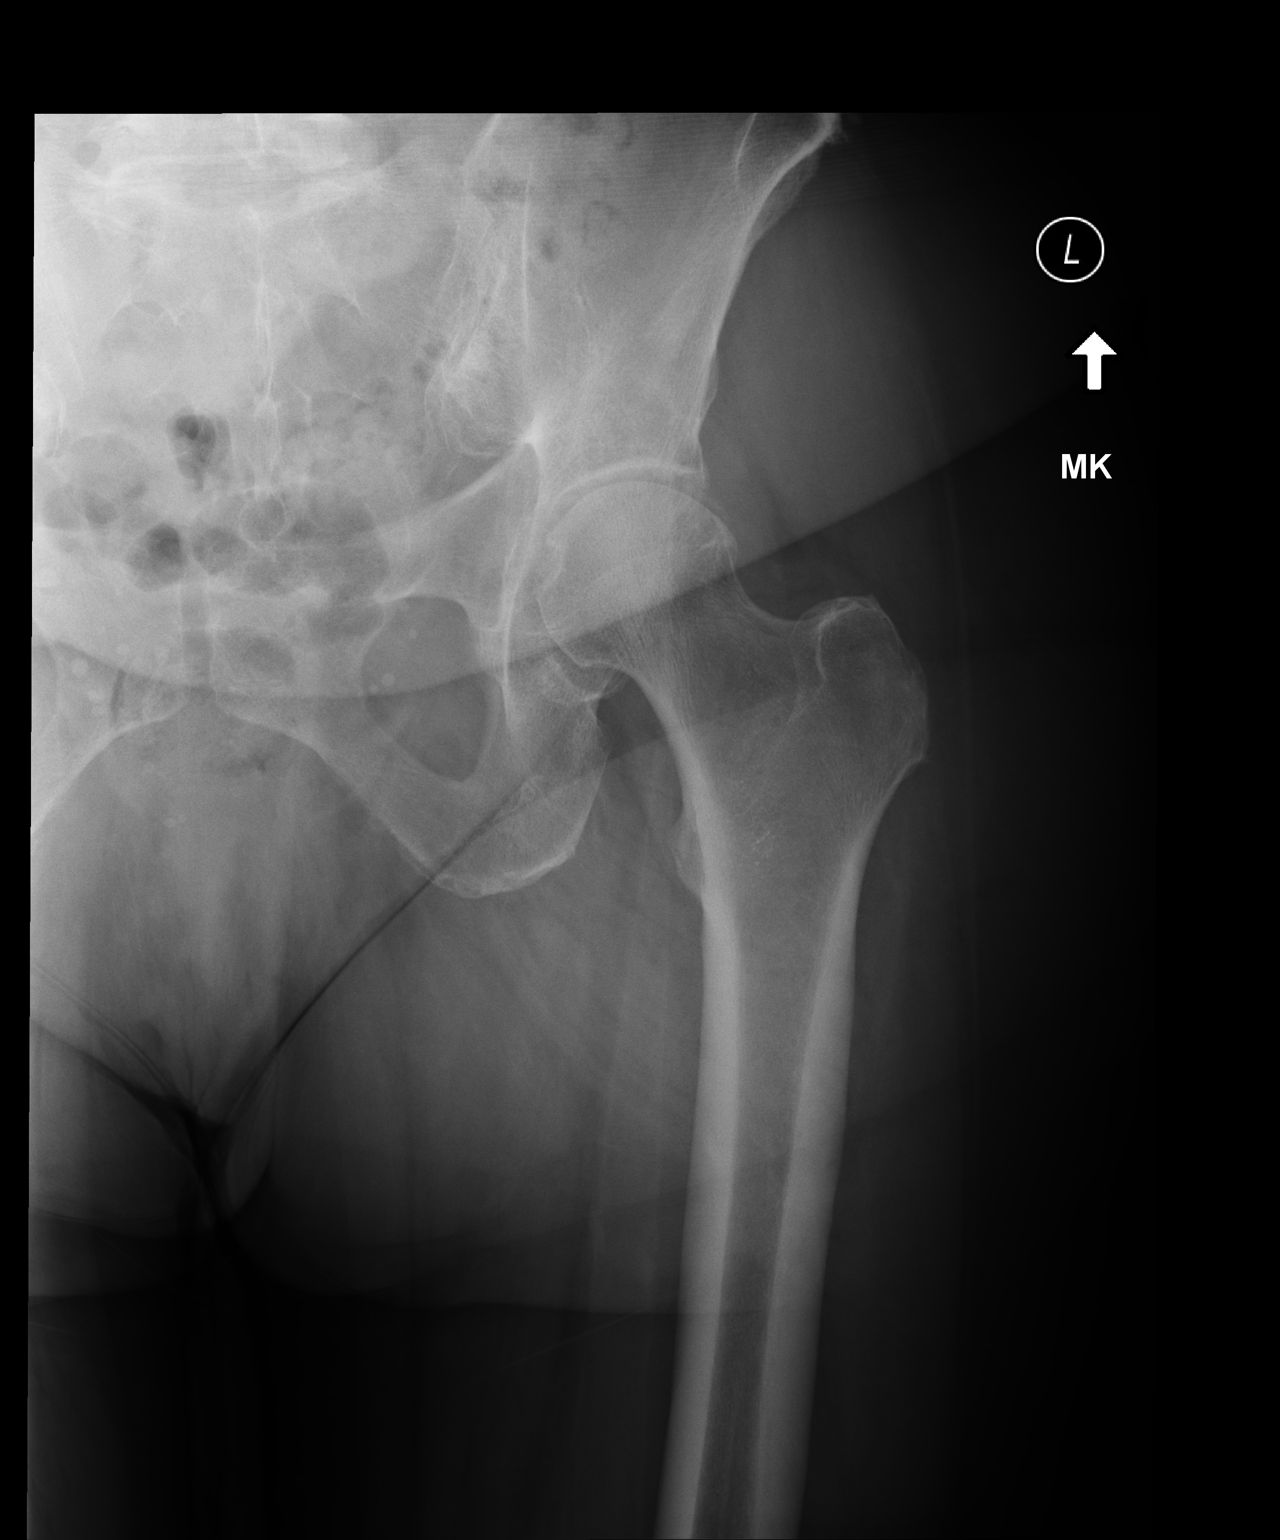

[1 of 1 positions shown; findings below may reference images not displayed]

FINDINGS: No fracture. Mild left hip degenerative change and chondrocalcinosis. 
Iliac wing enthesopathy. Osteopenia.
IMPRESSION: Mild left hip degenerative change and chondrocalcinosis.

## 2021-06-03 IMAGING — MR MRI LUMBAR SPINE WITHOUT CONTRAST
4 of 6 series · 21 of 48 positions shown · IV contrast (gadolinium)
Comparison: 04/16/2021 CT abdomen/pelvis.

________________________________________________________________________________________________ 
MRI LUMBAR SPINE WITHOUT CONTRAST, 06/03/2021 [DATE]: 
CLINICAL INDICATION: Lumbar radiculopathy. Low back pain radiating to the left 
groin for 2 weeks.
TECHNIQUE: Multiplanar, multiecho position MR images of the lumbar spine were 
performed without intravenous gadolinium enhancement. Patient was scanned on a 
1.5T magnet.

[Series 201: survey · axial · 10.0mm · 1.21mm/px · z∈[+97,+329]mm · 5 of 10 slices shown]
[im 1/10]
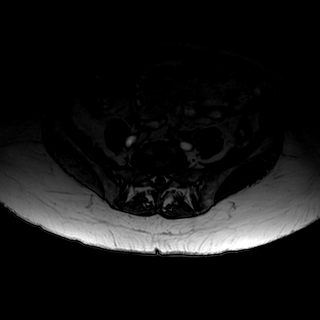
[im 3/10]
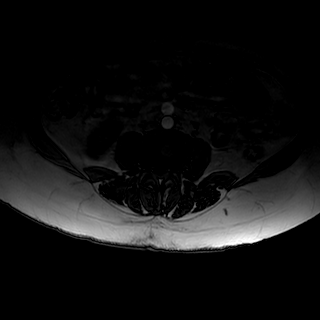
[im 5/10]
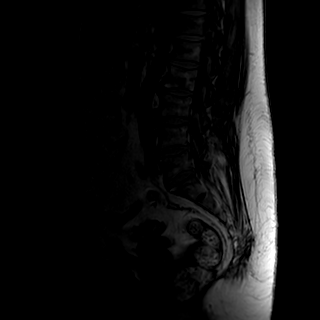
[im 7/10]
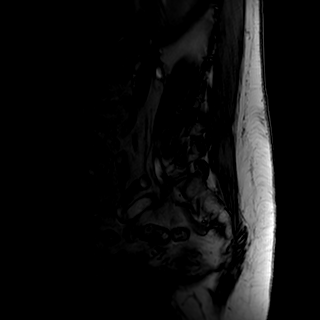
[im 10/10]
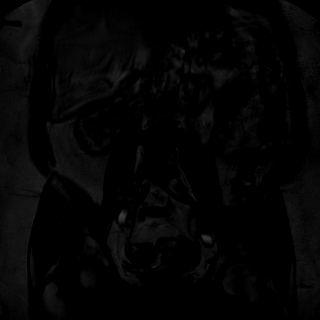

[Series 301: t2w_cor-surv · coronal · 6.0mm · 0.62mm/px · 5 of 10 slices shown]
[im 1/10]
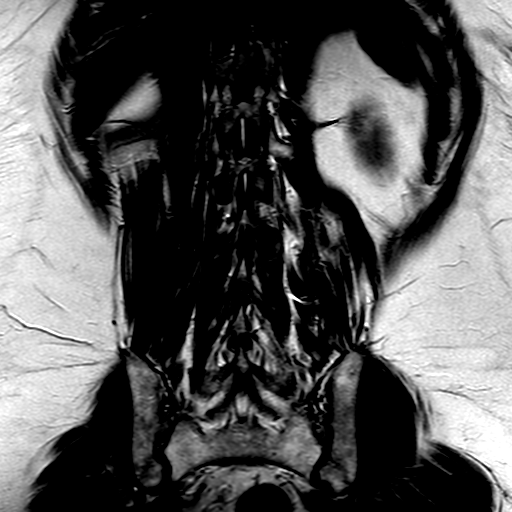
[im 3/10]
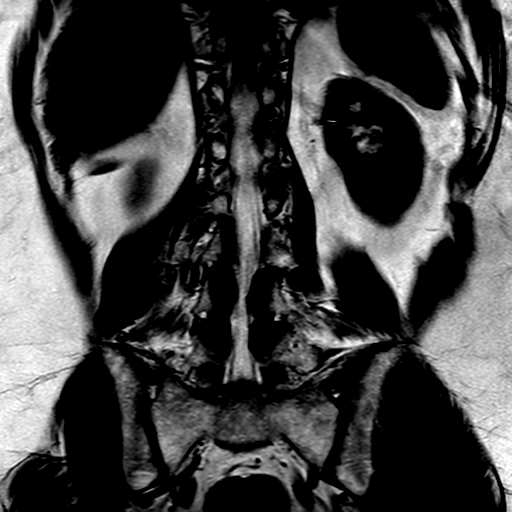
[im 5/10]
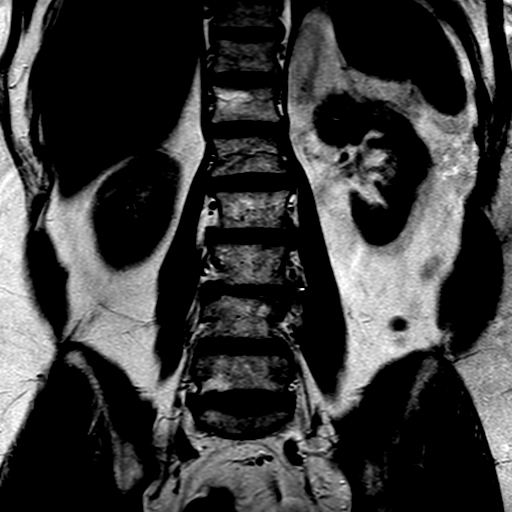
[im 7/10]
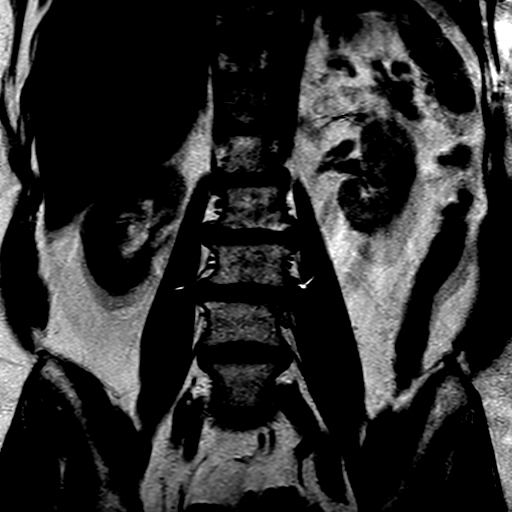
[im 10/10]
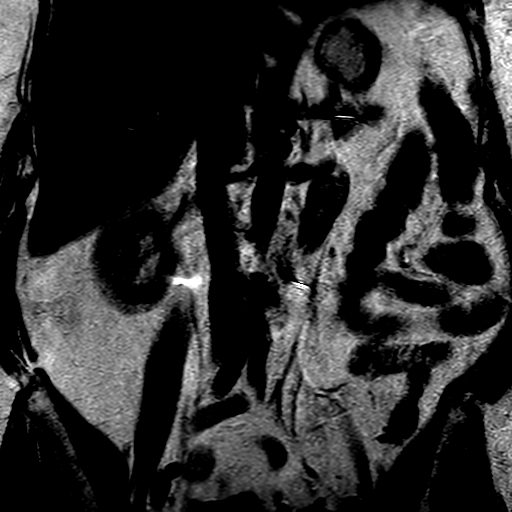

[Series 401: t1_tse_sag · sagittal · 4.0mm · 0.51mm/px · 8 of 17 slices shown]
[im 1/17]
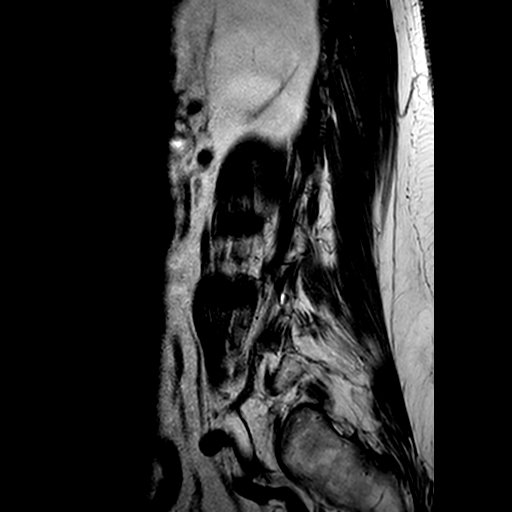
[im 3/17]
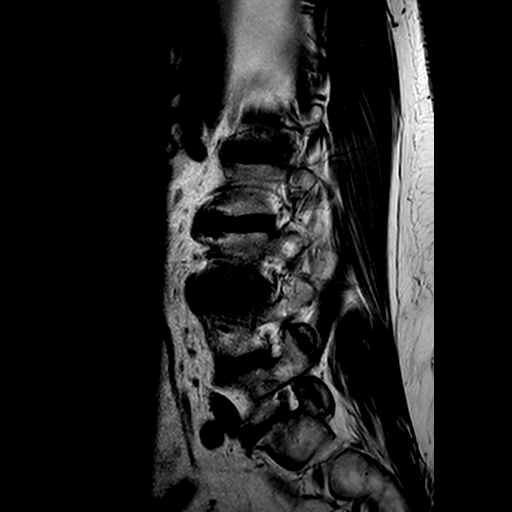
[im 5/17]
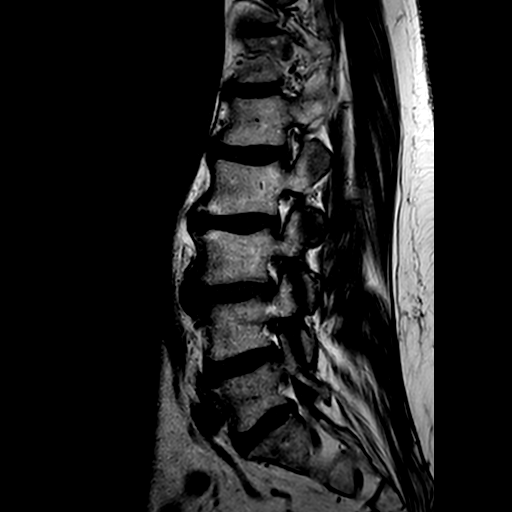
[im 7/17]
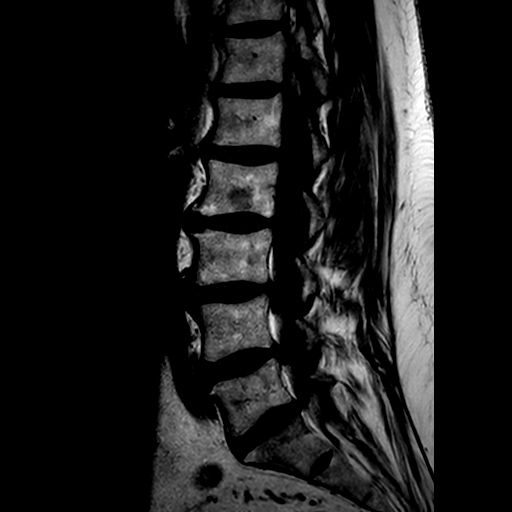
[im 10/17]
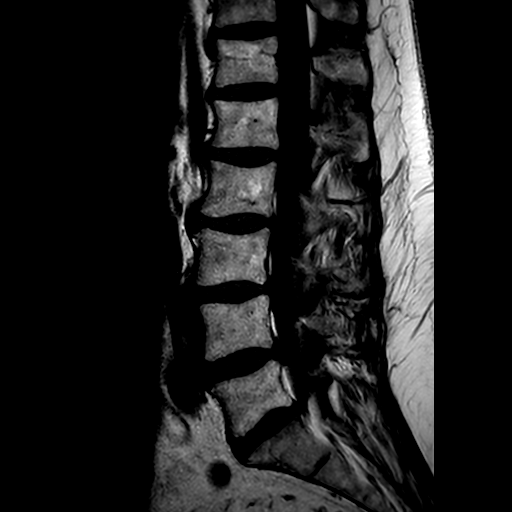
[im 12/17]
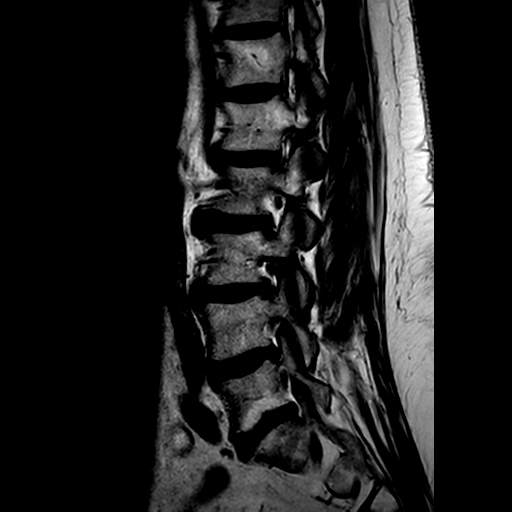
[im 14/17]
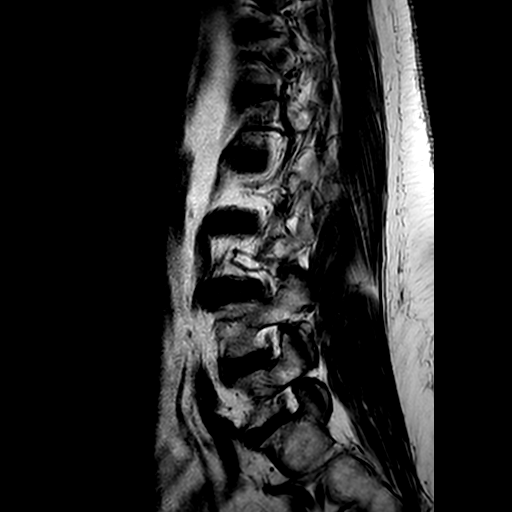
[im 17/17]
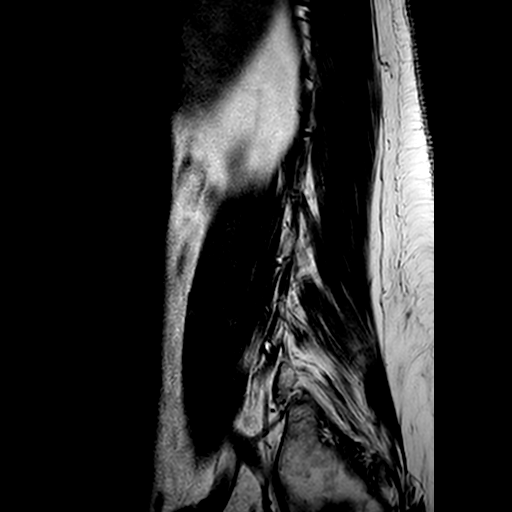

[Series 501: t2_tse_sag · sagittal · 4.0mm · 0.54mm/px · 3 of 17 slices shown]
[im 3/17]
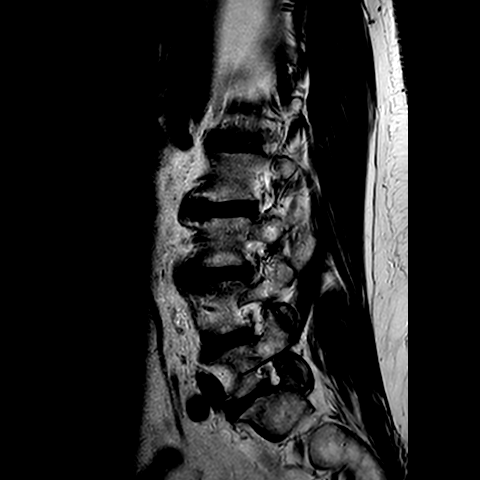
[im 10/17]
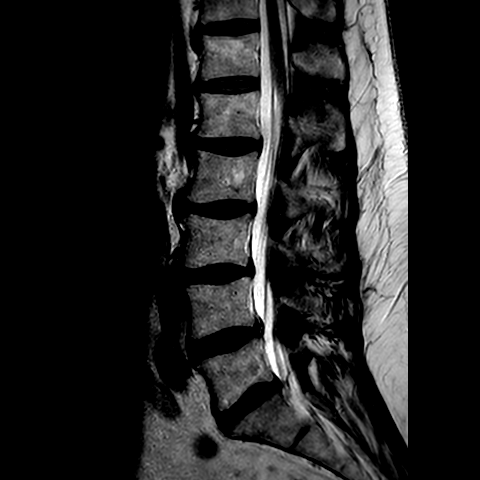
[im 14/17]
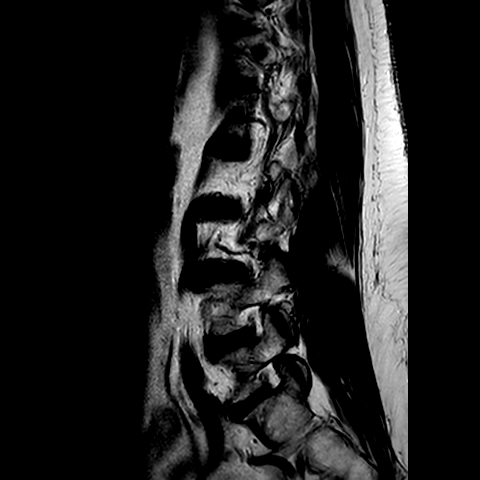

[21 of 48 positions shown; findings below may reference images not displayed]

FINDINGS: Nomenclature is based on 5 lumbar type vertebral bodies. The vertebral 
bodies are normal in height and alignment. No acute vertebral body fracture. 
Multilevel degenerative change. No spondylolisthesis. No pars defect. 1.2 cm 
mildly T1 hypointense rounded focus in the L2 vertebral body with preserved fat, 
may be atypical hemangioma. The conus tip terminates at the L1 vertebral body 
level. The aorta is normal in diameter. The posterior paraspinal soft tissues 
are negative.  
Modic I-II: None. 
Ligamentum Flavum > 2.5 mm: All levels 
T12-L1: The disc is normal in height and signal. No disc herniation. Normal 
facets. No spinal canal or neural foraminal stenosis. 
L1-L2: 7 mm intermediate signal intensity focus extends superior to the disc in 
the left medial foraminal region, highly worrisome for small disc extrusion with 
encroachment on the left L1 nerve root and left neural foraminal stenosis 
(sequence 401/501 image 4, sequence 701 image 25). The disc is normal in height 
with disc desiccation. Normal facets. No central canal stenosis.  
L2-L3: Mild disc bulge, mild disc space narrowing and disc desiccation. No disc 
herniation. Normal facets. No spinal canal or neural foraminal stenosis. 
L3-L4: Disc bulge abuts the right L3 nerve root. Mild disc space narrowing and 
disc desiccation. No disc herniation. Mild facet arthropathy and ligamentum 
flavum hypertrophy. Moderate lateral recess stenosis. No central canal stenosis. 
Mild neural foraminal stenosis. 
L4-L5: Annular tear in the right paracentral region. Mild disc bulge, mild disc 
space narrowing and disc desiccation. No disc herniation. Mild facet 
arthropathy. Mild central canal and moderate lateral recess stenosis. Mild 
neural foraminal stenosis. 
L5-S1: Annular tear in the central and right paracentral regions. Mild disc 
bulge, mild disc space narrowing and disc desiccation. No disc herniation. Mild 
facet arthropathy. No central canal stenosis. Mild neural foraminal stenosis.
IMPRESSION: 1.  7 mm intermediate signal intensity focus extends superior to the disc in the 
left medial foraminal region, highly worrisome for small disc extrusion with 
encroachment on the left L1 nerve root and left neural foraminal stenosis. 
Neurosurgical consultation is recommended. 
2.  Multilevel degenerative change, mild central canal stenosis L4-5, moderate 
lateral recess stenosis at L3-4/L4-5, annular tears at L4-5/L5-S1 and mild 
multilevel neural foraminal stenosis.

## 2021-10-20 ENCOUNTER — Telehealth: Admit: 2021-10-20 | Payer: PRIVATE HEALTH INSURANCE | Attending: Internal Medicine | Primary: Internal Medicine

## 2021-11-16 ENCOUNTER — Encounter: Admit: 2021-11-16 | Payer: MEDICARE | Attending: Internal Medicine | Primary: Internal Medicine

## 2021-12-15 ENCOUNTER — Inpatient Hospital Stay: Admit: 2021-12-15 | Discharge: 2021-12-15 | Payer: MEDICARE | Primary: Internal Medicine

## 2021-12-15 ENCOUNTER — Encounter: Admit: 2021-12-15 | Payer: MEDICARE | Attending: Internal Medicine | Primary: Internal Medicine

## 2021-12-15 DIAGNOSIS — M353 Polymyalgia rheumatica: Secondary | ICD-10-CM

## 2021-12-15 DIAGNOSIS — R7303 Prediabetes: Secondary | ICD-10-CM

## 2021-12-15 LAB — SEDIMENTATION RATE (ESR): BKR SEDIMENTATION RATE, ERYTHROCYTE: 12 mm/hr (ref 0–20)

## 2021-12-15 LAB — C-REACTIVE PROTEIN     (CRP): BKR C-REACTIVE PROTEIN, HIGH SENSITIVITY: 2.7 mg/L

## 2021-12-15 LAB — URIC ACID: BKR URIC ACID: 4.7 mg/dL (ref 2.7–7.3)

## 2021-12-15 LAB — RHEUMATOID FACTOR: BKR RHEUMATOID FACTOR: <10 IU/mL (ref <14.0)

## 2021-12-16 LAB — HEMOGLOBIN A1C
BKR ESTIMATED AVERAGE GLUCOSE: 123 mg/dL
BKR HEMOGLOBIN A1C: 5.9 % — ABNORMAL HIGH (ref 4.0–5.6)

## 2021-12-16 LAB — ANA IFA SCREEN W/REFL TO TITER AND PATTERN, IFA: BKR ANTI-NUCLEAR ANTIBODY (ANA): <1:80 {titer}

## 2021-12-25 ENCOUNTER — Encounter
Admit: 2021-12-25 | Payer: PRIVATE HEALTH INSURANCE | Attending: Vascular and Interventional Radiology | Primary: Internal Medicine

## 2022-01-31 ENCOUNTER — Ambulatory Visit: Admit: 2022-01-31 | Payer: MEDICARE | Attending: Allergy and Immunology | Primary: Internal Medicine

## 2022-01-31 DIAGNOSIS — Z886 Allergy status to analgesic agent status: Secondary | ICD-10-CM

## 2022-01-31 MED ORDER — ESOMEPRAZOLE MAGNESIUM 20 MG TABLET,DELAYED RELEASE
20 mg | Status: AC
Start: 2022-01-31 — End: ?

## 2022-01-31 MED ORDER — UNABLE TO FIND
Status: AC
Start: 2022-01-31 — End: ?

## 2022-01-31 NOTE — Patient Instructions
Please avoid all anti-histamines x 1 week prior to test appt (Allegra, Claritin, Zyrtec, Xyzal, Zaditor, Patanol, Pataday, Patanase, Astelin, Astepro or any of their generics).You may use Benadryl as needed up to 3 days (72 hrs) prior to your appointment.If you take any sleep aid or cough syrups/medicines please make sure that it does not contain Benadryl (dyphenhydramine) or any anti-histamine (if you have any questions you may call the clinic prior to your next visit).

## 2022-02-01 NOTE — Other
Earleen Reaper, MD Raynelle Bring, MD  Sol Passer, MD Jerrell Belfast, MD  Starling Manns, MD Gerda Diss, DO  Genene Churn, MD      ADULT ALLERGY & IMMUNOLOGY NEW PATIENT VISITReferring provider:Collin Curley Spice, MDCC: evaluation for [id number] Duhe is a 71 y.o. female who presents for initial allergic evaluation for NSAIDs. History is obtained from patient and husband. Ants to know if allergic to NSAIDs, had also some anxiety and was not sure if anxious or had an allergy so attributed the difficulty breathing to NSAIDs but that still happens to her  With anxiety so does no want to restart them and feel that way. Used to take Celebrex and has not taken in years. Has osteoarthritis and hands are swollen and painful. Prednisone affected her retina. Took after that for 1 week as has PMR and was fine. She had taken it for a year and that is why she had retinal problems.12 point ROS: Unless previously indicated the remaining of the systems are negative.Allergies: Allergies Allergen Reactions ? Celebrex [Celecoxib]    Breathing difficulty ? Codeine    Nausea ? Nsaids (Non-Steroidal Anti-Inflammatory Drug) Resp Distress   Breathing difficulty ? Adhesive Tape-Silicones Rash PMHx: Past Medical History: Diagnosis Date ? Anxiety disorder  ? Asthma   well controlled ? Blepharitis with rosacea  ? Closed 4-part fracture of proximal humerus, right, initial encounter 03/19/2019 ? CTS (carpal tunnel syndrome)  ? Elevated BP 10/22/2013 ? Fibromyalgia  ? Internal hemorrhoids  ? Macular degeneration  ? Motion sickness  ? Obesity  ? Polymyalgia rheumatica (HC Code) (HC CODE) (HC Code)  ? Prediabetes  ? Rosacea  Surgical [id number] Surgical History: Procedure Laterality Date ? ADENOIDECTOMY    child ? CARPAL TUNNEL RELEASE Bilateral  ? CHOLECYSTECTOMY, LAPAROSCOPIC  2014 ? DILATION AND CURETTAGE OF UTERUS   ? ERCP   ? HYSTEROSCOPY   ? OVARIAN CYST REMOVAL  1970 ? TONSILLECTOMY    child ? TOTAL SHOULDER REPLACEMENT Right 2020  reverse ? VERTICAL BANDED GASTROPLASTY  2007 Family Hx: mother and father are deceased. Parents did not have allergic rhinitis or eczema.Niece with asthma.Family History Problem Relation Age of Onset ? Breast cancer Mother 34 ? Pancreatic cancer Mother  ? Cancer Father       Lung - smoker ? Heart disease Sister  ? Kidney disease Sister  ? Hypertension Sister  ? Cancer Brother  ? No Known Problems Sister  ? No Known Problems Brother  ? Pancreatic cancer Maternal Uncle  ? Pancreatic cancer Maternal Grandfather  ? Colon cancer Neg Hx  ? Ovarian cancer Neg Hx  ? Thrombophilia Neg Hx  She indicated that her mother is deceased. She indicated that her father is deceased. She indicated that only one of her two sisters is alive. She indicated that only one of her two brothers is alive. She indicated that the status of her maternal grandfather is unknown. She indicated that the status of her maternal uncle is unknown. She indicated that the status of her neg hx is unknown.Social Hx: Married, lives in a house. (+) wall to wall in the hallway and the den, no pets. Retired and worked in [employer]. Kewana Miars  reports that she quit smoking about 26 years ago. Her smoking use included cigarettes. She has never used smokeless tobacco. She reports current alcohol use. She reports current drug use.Meds: Current Outpatient Medications: ?  atorvastatin (LIPITOR) 10 mg tablet, Take 1 tablet (10 mg total) by mouth daily., Disp: 90  tablet, Rfl: 0?  biotin 1 mg tablet, Take 1 tablet (1,000 mcg total) by mouth every morning., Disp: , Rfl: ?  calcium carbonate 1250 mg (500 mg calcium) - Vitamin D3 400 Unit  (OYSTERCAL-D 500 MG) tablet, Take 1 tablet by mouth daily., Disp: , Rfl: ?  cholecalciferol, vitamin D3, 1,000 unit tablet, Take 1,250 Units by mouth every morning., Disp: , Rfl: ?  clobetasoL (TEMOVATE) 0.05 % cream, Apply topically once a week., Disp: 45 g, Rfl: 4?  esomeprazole magnesium 20 mg TbEC, , Disp: , Rfl: ?  meclizine (ANTIVERT) 25 mg tablet, Take 1 tablet (25 mg total) by mouth 3 (three) times daily as needed., Disp: 30 tablet, Rfl: 0?  UNABLE TO FIND, Glucosamine/Chondrointon 1500 mg/1200 mg, Disp: , Rfl: ?  vit C/E/Zn/coppr/lutein/zeaxan (PRESERVISION AREDS-2 ORAL), Take 1 tablet by mouth 2 (two) times daily. , Disp: , Rfl:  Labs: no previous allergic testing availableComponent    Latest Ref Rng 12/15/2021 Hemoglobin A1c    4.0 - 5.6 % 5.9 (H)  Estimated Average Glucose    mg/dL 161  Uric Acid    2.7 - 7.3 mg/dL 4.7  ANA    <0:96 (Negative)  <1:80  Sedimentation Rate (ESR)    0 - 20 mm/hr 12  CRP, High Sensitivity    See comment mg/L 2.7  Rheumatoid Factor    <14.0 IU/mL <10.0   Legend:(H) HighImaging: MRI brain from 9/2022IMPRESSION: Scattered white matter changes which are nonspecific, most commonly seen in the setting of chronic microvascular ischemia. No acute intracranial abnormality.I have personally reviewed and interpreted the above studies and show elevated HgA1C and noPhysical exam: BP (!) 145/71  - Pulse 86  - Temp 99.3 ?F (37.4 ?C) (Temporal)  - Resp 14  - SpO2 97% Physical ExamVitals reviewed. Constitutional:     General: She is not in acute distress.   Appearance: She is not toxic-appearing or diaphoretic. HENT:    Head: Normocephalic and atraumatic.    Right Ear: External ear normal.    Left Ear: External ear normal.    Nose: Nose normal. No congestion or rhinorrhea.    Mouth/Throat:    Mouth: Mucous membranes are moist.    Pharynx: Oropharynx is clear. No oropharyngeal exudate or posterior oropharyngeal erythema. Eyes:    General: No scleral icterus.      Right eye: No discharge.       Left eye: No discharge. Extraocular Movements: Extraocular movements intact.    Conjunctiva/sclera: Conjunctivae normal.    Pupils: Pupils are equal, round, and reactive to light. Cardiovascular:    Rate and Rhythm: Normal rate and regular rhythm.    Heart sounds: Normal heart sounds. No murmur heard.Pulmonary:    Effort: Pulmonary effort is normal. No respiratory distress.    Breath sounds: No wheezing or rhonchi. Musculoskeletal:    Cervical back: Normal range of motion. Skin:   Coloration: Skin is not jaundiced.    Findings: No rash. Neurological:    General: No focal deficit present.    Mental Status: She is alert and oriented to person, place, and time. Psychiatric:       Mood and Affect: Mood normal. Skin test: no recent allergy testing availableAssessment/Plan: 71 y.o. female who presents for initial allergic evaluation of   SNOMED [place](R) 1. Allergy to nonsteroidal anti-inflammatory drug (NSAID)  ALLERGY TO NON-STEROIDAL ANTI-INFLAMMATORY AGENT No orders of the defined types were placed in this encounter.Meds: nonePatient's presentation is not necessarily consistent with allergy to NSAIDs.  We have discussed  diagnostic options and the fact that skin testing to NSAIDs is not necessarily diagnostic of non IgE mediated reactions.  Therefore the best option is to pursue an oral graded challenge and I have suggested that given her history of arthritis we challenged her to celecoxib.  There are numerous benefits to selecting a Cox 2 inhibitor for challenge even if she uses this on a p.r.n. basis in the long term it might be beneficial as it can avoid her having GI bleed or acute/chronic renal injury from NSAID use.  She is agreeable to this and I will touch base with the clinic and hospital pharmacy in order to try to coordinate a graded oral challenge to Celebrex prior to her leaving to [place] in October 10.If for whatever reason she is unable to do this in our clinic, I would recommend her to go to either [facility] or [facility] for such.I am sending her an electronic consent for the graded oral challenge and once we have hopefully secured a date and time for her to pursue this the office will be contacting her.  I have also instructed her about avoidance of antihistamines 1 week prior to testing.  She is only taking meclizine because of vertigo that she develop prior to this appointment. Genene Churn, MD, FAAAAIAssistant Clinical ProfessorSection of Rheumatology, Allergy & Clinical Immunology Department of [facility] [employer] [facility] [employer]

## 2022-02-02 ENCOUNTER — Encounter: Admit: 2022-02-02 | Payer: PRIVATE HEALTH INSURANCE | Attending: Allergy and Immunology | Primary: Internal Medicine

## 2022-02-18 ENCOUNTER — Encounter: Admit: 2022-02-18 | Payer: PRIVATE HEALTH INSURANCE | Primary: Internal Medicine

## 2022-02-24 MED ORDER — CELECOXIB 100 MG CAPSULE
100 mg | Freq: Once | ORAL | Status: CP | PRN
Start: 2022-02-24 — End: ?
  Administered 2022-02-25: 17:00:00 100 mg via ORAL

## 2022-02-24 MED ORDER — DIPHENHYDRAMINE 50 MG CAPSULE
50 mg | Freq: Once | ORAL | Status: DC | PRN
Start: 2022-02-24 — End: 2022-03-01

## 2022-02-24 MED ORDER — EPINEPHRINE 0.3 MG/0.3 ML INJECTION, AUTO-INJECTOR
0.30.3 mg/ mL | Freq: Once | INTRAMUSCULAR | Status: DC | PRN
Start: 2022-02-24 — End: 2022-03-01

## 2022-02-24 MED ORDER — CELECOXIB 100 MG CAPSULE
100 mg | Freq: Once | ORAL | Status: CP
Start: 2022-02-24 — End: ?
  Administered 2022-02-25: 16:00:00 100 mg via ORAL

## 2022-02-24 MED ORDER — DIPHENHYDRAMINE 50 MG/ML INJECTION (WRAPPED E-RX)
50 mg/mL | Freq: Once | INTRAMUSCULAR | Status: DC | PRN
Start: 2022-02-24 — End: 2022-03-01

## 2022-02-25 ENCOUNTER — Ambulatory Visit: Admit: 2022-02-25 | Payer: MEDICARE | Attending: Allergy and Immunology | Primary: Internal Medicine

## 2022-02-25 ENCOUNTER — Encounter: Admit: 2022-02-25 | Payer: PRIVATE HEALTH INSURANCE | Primary: Internal Medicine

## 2022-02-25 ENCOUNTER — Encounter
Admit: 2022-02-25 | Payer: PRIVATE HEALTH INSURANCE | Attending: Vascular and Interventional Radiology | Primary: Internal Medicine

## 2022-02-25 ENCOUNTER — Ambulatory Visit: Admit: 2022-02-25 | Payer: MEDICARE | Primary: Internal Medicine

## 2022-02-25 DIAGNOSIS — Z0182 Encounter for allergy testing: Secondary | ICD-10-CM

## 2022-02-25 DIAGNOSIS — M353 Polymyalgia rheumatica: Secondary | ICD-10-CM

## 2022-02-25 DIAGNOSIS — S42291A Other displaced fracture of upper end of right humerus, initial encounter for closed fracture: Secondary | ICD-10-CM

## 2022-02-25 DIAGNOSIS — Z889 Allergy status to unspecified drugs, medicaments and biological substances status: Secondary | ICD-10-CM

## 2022-02-25 DIAGNOSIS — H01009 Unspecified blepharitis unspecified eye, unspecified eyelid: Secondary | ICD-10-CM

## 2022-02-25 DIAGNOSIS — J45909 Unspecified asthma, uncomplicated: Secondary | ICD-10-CM

## 2022-02-25 DIAGNOSIS — Z886 Allergy status to analgesic agent status: Secondary | ICD-10-CM

## 2022-02-25 DIAGNOSIS — M797 Fibromyalgia: Secondary | ICD-10-CM

## 2022-02-25 DIAGNOSIS — K648 Other hemorrhoids: Secondary | ICD-10-CM

## 2022-02-25 DIAGNOSIS — T753XXA Motion sickness, initial encounter: Secondary | ICD-10-CM

## 2022-02-25 DIAGNOSIS — IMO0001 Elevated BP: Secondary | ICD-10-CM

## 2022-02-25 DIAGNOSIS — G5600 Carpal tunnel syndrome, unspecified upper limb: Secondary | ICD-10-CM

## 2022-02-25 DIAGNOSIS — E669 Obesity, unspecified: Secondary | ICD-10-CM

## 2022-02-25 DIAGNOSIS — H3530 Unspecified macular degeneration: Secondary | ICD-10-CM

## 2022-02-25 DIAGNOSIS — L719 Rosacea, unspecified: Secondary | ICD-10-CM

## 2022-02-25 DIAGNOSIS — F419 Anxiety disorder, unspecified: Secondary | ICD-10-CM

## 2022-02-25 DIAGNOSIS — R7303 Prediabetes: Secondary | ICD-10-CM

## 2022-02-25 MED ORDER — ALBUTEROL SULFATE 2.5 MG/3 ML (0.083 %) SOLUTION FOR NEBULIZATION
2.530.083 mg /3 mL (0.083 %) | Status: DC
Start: 2022-02-25 — End: 2022-02-25

## 2022-02-25 MED ORDER — HYDROCORTISONE SODIUM SUCCINATE 100 MG SOLUTION FOR INJECTION
100 mg | Status: DC
Start: 2022-02-25 — End: 2022-02-25

## 2022-02-25 NOTE — Patient Instructions
You passed an oral challenge to Celebrex on 02/25/22 and are no longer considered allergic to it.You may receive it If medically necessary.

## 2022-02-25 NOTE — Progress Notes
Earleen Reaper, MD Raynelle Bring, MD  Sol Passer, MD Jerrell Belfast, MD  Starling Manns, MD Gerda Diss, DO  Genene Churn, MD       ALLERGY & CLINICAL IMMUNOLOGYFOLLOW UP CLINIC VISITToday's date: 9/22/2023CC: History of allergy to CelebrexS: Erin Walker is a 71 y.o. female who is seen in consultation for evaluation of Celebrex allergy. History provided by patient.  Her husband is present during this visit. No new medical problems. No anti-histamines taken during the past week.  She refers she used to tell providers that she will get respiratory distress with other NSAIDs besides Celebrex but she does not really remember taking NSAIDs because of how she felt with Celebrex which in retrospect she thinks was anxiety related.12 point Review of Systems: Unless previously indicated the remaining of the systems are negative.Past Medical History:Erin Walker  has a past medical history of Anxiety disorder, Asthma, Blepharitis with rosacea, Closed 4-part fracture of proximal humerus, right, initial encounter (03/19/2019), CTS (carpal tunnel syndrome), Elevated BP (10/22/2013), Fibromyalgia, Internal hemorrhoids, Macular degeneration, Motion sickness, Obesity, Polymyalgia rheumatica (HC Code) (HC CODE) (HC Code), Prediabetes, and Rosacea.She has no past medical history of At risk for malignant hyperthermia, PONV (postoperative nausea and vomiting), or Refusal of blood product. Past Surgical History:Erin Walker  has a past surgical history that includes Ovarian cyst removal (1970); Adenoidectomy; Tonsillectomy; Hysteroscopy; Dilation and curettage of uterus; ERCP; Cholecystectomy, laparoscopic (2014); Carpal tunnel release (Bilateral); Vertical Banded Gastroplasty (2007); and Total shoulder replacement (Right, 2020).Social History Tobacco Use ? Smoking status: Former   Current packs/day: 0.00   Types: Cigarettes   Quit date: 06/14/1995   Years since quitting: 26.7 ? Smokeless tobacco: Never Vaping Use ? Vaping Use: Never used Substance Use Topics ? Alcohol use: Yes   Comment: rarely ? Drug use: Yes Medications: Current Outpatient Medications: ?  atorvastatin (LIPITOR) 10 mg tablet, Take 1 tablet (10 mg total) by mouth daily., Disp: 90 tablet, Rfl: 0?  biotin 1 mg tablet, Take 1 tablet (1,000 mcg total) by mouth every morning., Disp: , Rfl: ?  calcium carbonate 1250 mg (500 mg calcium) - Vitamin D3 400 Unit  (OYSTERCAL-D 500 MG) tablet, Take 1 tablet by mouth daily., Disp: , Rfl: ?  cholecalciferol, vitamin D3, 1,000 unit tablet, Take 1,250 Units by mouth every morning., Disp: , Rfl: ?  clobetasoL (TEMOVATE) 0.05 % cream, Apply topically once a week., Disp: 45 g, Rfl: 4?  esomeprazole magnesium 20 mg TbEC, , Disp: , Rfl: ?  meclizine (ANTIVERT) 25 mg tablet, Take 1 tablet (25 mg total) by mouth 3 (three) times daily as needed., Disp: 30 tablet, Rfl: 0?  UNABLE TO FIND, Glucosamine/Chondrointon 1500 mg/1200 mg, Disp: , Rfl: ?  vit C/E/Zn/coppr/lutein/zeaxan (PRESERVISION AREDS-2 ORAL), Take 1 tablet by mouth 2 (two) times daily. , Disp: , Rfl: Allergies: Allergies Allergen Reactions ? Celebrex [Celecoxib]    Breathing difficulty ? Codeine    Nausea ? Nsaids (Non-Steroidal Anti-Inflammatory Drug) Resp Distress   Breathing difficulty ? Adhesive Tape-Silicones Rash Objective: Blood pressure (!) 127/56, pulse 78, temperature 98.2 ?F (36.8 ?C), temperature source Temporal, SpO2 97 %, not currently breastfeeding.There is no height or weight on file to calculate BMI.Physical ExamVitals reviewed. Constitutional:     General: She is not in acute distress.   Appearance: She is not toxic-appearing or diaphoretic. HENT:    Head: Normocephalic and atraumatic.    Right Ear: External ear normal.    Left Ear: External ear normal.    Nose: Nose normal. No congestion  or rhinorrhea. Mouth/Throat:    Mouth: Mucous membranes are moist.    Pharynx: Oropharynx is clear. No oropharyngeal exudate or posterior oropharyngeal erythema. Eyes:    General: No scleral icterus.      Right eye: No discharge.       Left eye: No discharge.    Extraocular Movements: Extraocular movements intact.    Conjunctiva/sclera: Conjunctivae normal.    Pupils: Pupils are equal, round, and reactive to light. Cardiovascular:    Rate and Rhythm: Normal rate and regular rhythm.    Heart sounds: Normal heart sounds. No murmur heard.Pulmonary:    Effort: Pulmonary effort is normal. No respiratory distress.    Breath sounds: No wheezing or rhonchi. Musculoskeletal:    Cervical back: Normal range of motion. Skin:   Coloration: Skin is not jaundiced.    Findings: No rash. Neurological:    General: No focal deficit present.    Mental Status: She is alert and oriented to person, place, and time. Psychiatric:       Mood and Affect: Mood normal. Labs & Diagnostics data: Oral Challenge :  02/25/2022 Oral Challenge  Date/Time of last antihistamine >1 week  Are you on any beta blockers? N  Are you taking MAOI? no  Time Challenge Started 12:26  Name of food/agent #1 Celebrex  Dose Given #1 100 mg  Reaction #1 Patient observed for 30 minutes. No issues/reactions noted.  Name of food/agent #2 Celebrex  Dose Given #2 100 mg  Reaction #2 Patient observed for 60 minutes. No issues/reactions noted.  Oral Challenge interpretation: successfully passed oral challenge to 200 mg with no signs or symptoms of allergic reaction after 90 mins of monitoring.Impression & Recommendations 71 y.o. female who is seen for allergic evaluation of Encounter Diagnoses Name SNOMED [place](R) Primary? ? History of drug allergy ALLERGY TO DRUG Yes  Comment: Celecoxib ?	Indications, possible benefits and risks from Celebrex oral challenge reviewed. Informed consent obtained.?	We have discussed DDx, possible diagnostic tests and available treatment options based on possible test results.?	Patient's presentation is not consistent with immediate hypersensitivity to Celebrex based on today's test results.?	Patient is not considered allergic to Celebrex and may receive if medically indicated.?	Celecoxib allergy has been removed from the listed drug allergies in the electronic medical record.?	Test results are not predictive of delayed hypersensitivity/cell mediated reactions and patient is aware of this and will contact us if there are any reactions after leaving clinic.?	She would like to take other NSAIDs at home rather than come to clinic for graded oral challenge and I have asked her not to do that today but next week when the clinic is open in the rare case that she has any symptoms.  She will notify us either via MyChart or phone call if she takes any other NSAIDs and does well in order to remove her documented NSAID allergy from her chart.All of the patient's questions answered to her satisfaction. She has verbalized agreement with plan as previously outlined.Genene Churn, MD, FAAAAIAssistant Clinical ProfessorSection of Rheumatology, Allergy & Clinical Immunology Department of [employer] [employer]

## 2022-02-26 DIAGNOSIS — Z885 Allergy status to narcotic agent status: Secondary | ICD-10-CM

## 2022-02-26 DIAGNOSIS — Z79899 Other long term (current) drug therapy: Secondary | ICD-10-CM

## 2022-02-26 DIAGNOSIS — M797 Fibromyalgia: Secondary | ICD-10-CM

## 2022-02-26 DIAGNOSIS — Z9049 Acquired absence of other specified parts of digestive tract: Secondary | ICD-10-CM

## 2022-02-26 DIAGNOSIS — M353 Polymyalgia rheumatica: Secondary | ICD-10-CM

## 2022-02-26 DIAGNOSIS — J45909 Unspecified asthma, uncomplicated: Secondary | ICD-10-CM

## 2022-02-26 DIAGNOSIS — Z8719 Personal history of other diseases of the digestive system: Secondary | ICD-10-CM

## 2022-02-26 DIAGNOSIS — Z886 Allergy status to analgesic agent status: Secondary | ICD-10-CM

## 2022-02-26 DIAGNOSIS — Z87891 Personal history of nicotine dependence: Secondary | ICD-10-CM

## 2022-02-26 DIAGNOSIS — E669 Obesity, unspecified: Secondary | ICD-10-CM

## 2022-02-26 DIAGNOSIS — F419 Anxiety disorder, unspecified: Secondary | ICD-10-CM

## 2022-03-01 NOTE — Progress Notes
Patient here today for Celebrex oral challenge.  Patient denies taking any antihistamines, or feeling ill, and vitals signs stable.  Patient given Celebrex 100 mg capsules x 2 doses  and observed for one hour and 30 minutes as ordered per MD(flow sheet for details). No issues/reactions noted. Patient was seen and discharged by MD.

## 2022-03-07 ENCOUNTER — Ambulatory Visit: Admit: 2022-03-07 | Payer: MEDICARE | Attending: Allergy and Immunology | Primary: Internal Medicine

## 2022-03-08 ENCOUNTER — Encounter: Admit: 2022-03-08 | Payer: PRIVATE HEALTH INSURANCE | Attending: Allergy and Immunology | Primary: Internal Medicine

## 2022-03-21 IMAGING — MG MAMMOGRAPHY SCREENING BILATERAL 3[PERSON_NAME]
8 series · 8 of 24 positions shown · non-contrast
Comparison: Comparison was made to prior examinations.

________________________________________________________________________________________________ 
MAMMOGRAPHY SCREENING BILATERAL 3BRENNAN AUJLA, 03/21/2022 [DATE]: 
CLINICAL INDICATION: Encounter for screening mammogram.
TECHNIQUE: Digital bilateral mammograms and 3-D Tomosynthesis were obtained. 
These were interpreted both primarily and with the aid of computer-aided 
detection system.  
BREAST DENSITY: (Level C) The breasts are heterogeneously dense, which may 
obscure small masses.

[R MLO]
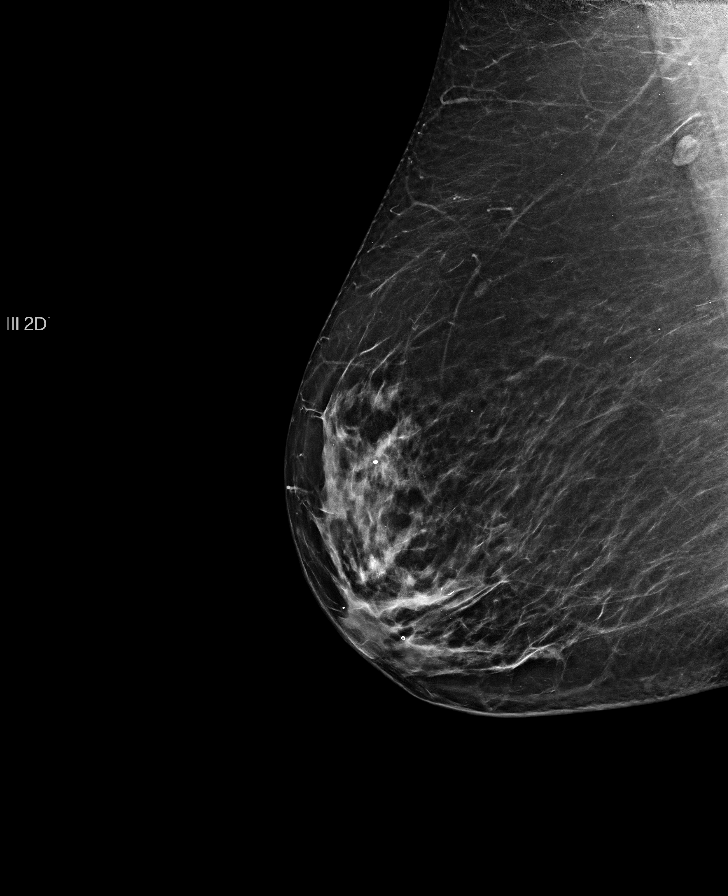

[L CC]
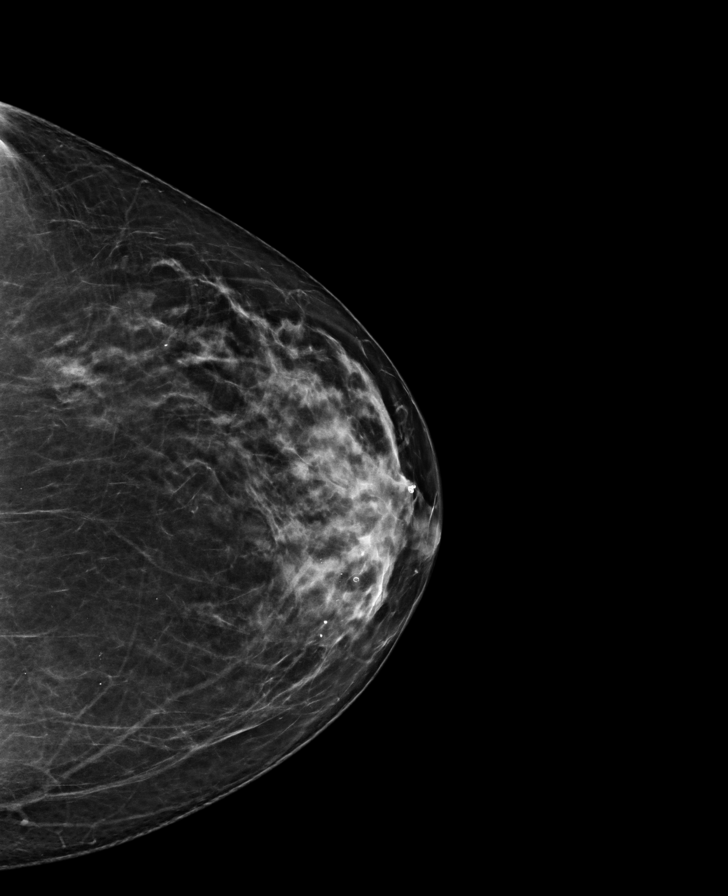

[R CC]
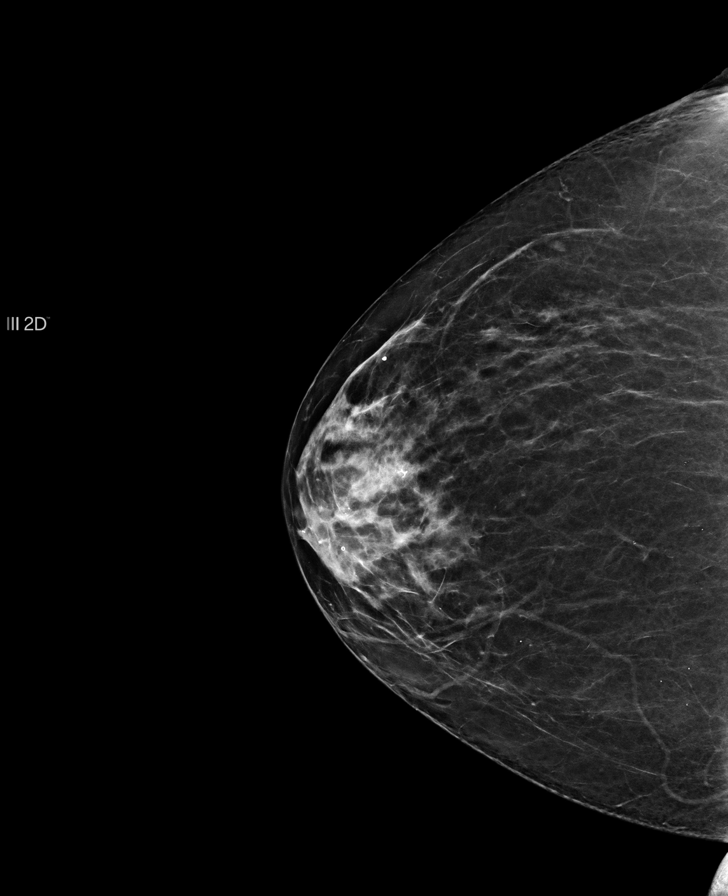

[L MLO]
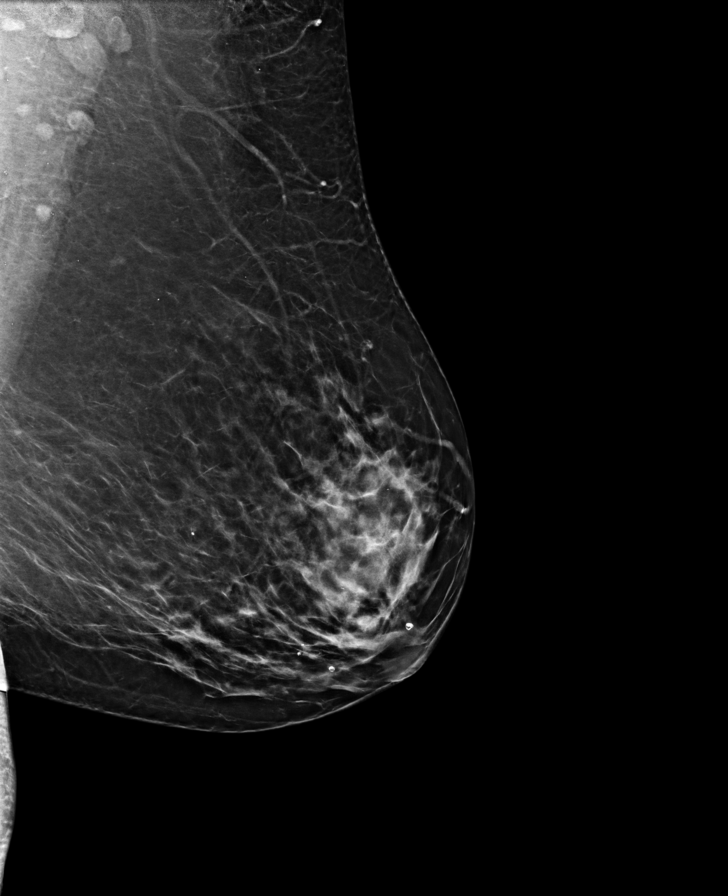

[L MLO tomo · tomo slice 43/86.0]
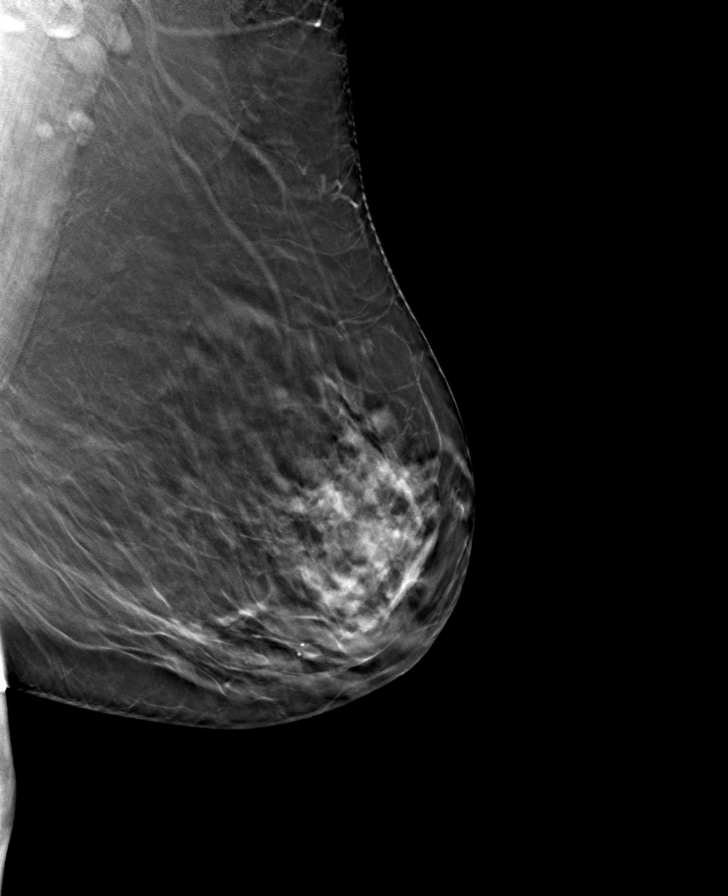

[R MLO tomo · tomo slice 39/76.0]
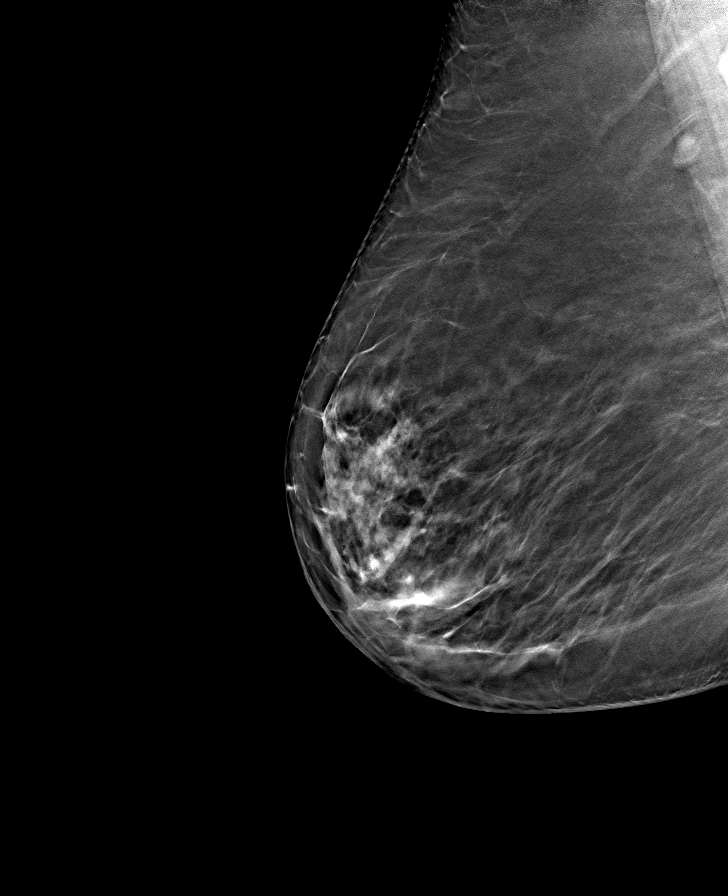

[L CC tomo · tomo slice 35/70.0]
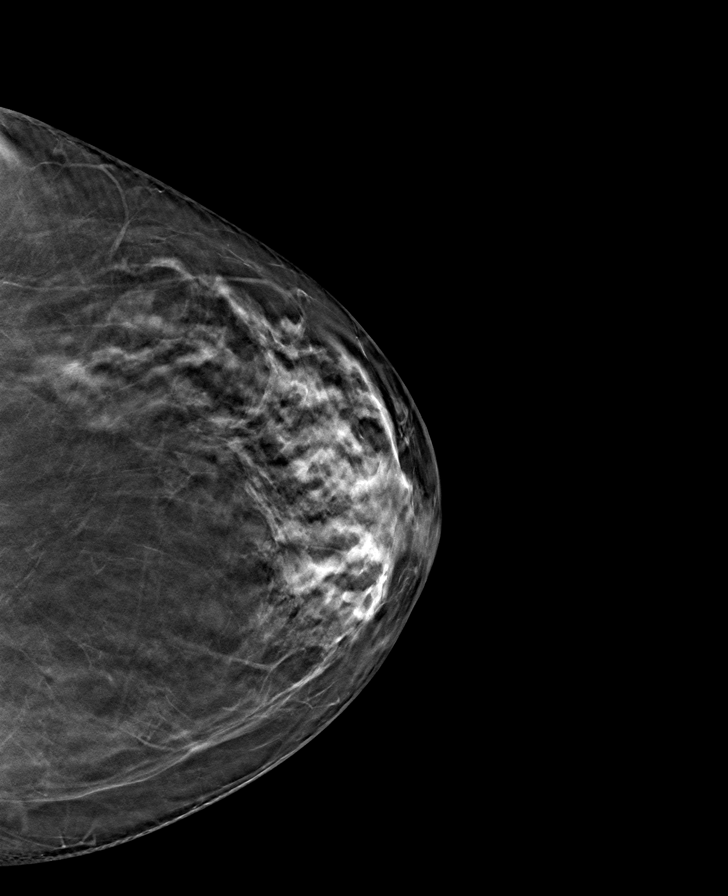

[R CC tomo · tomo slice 35/69.0]
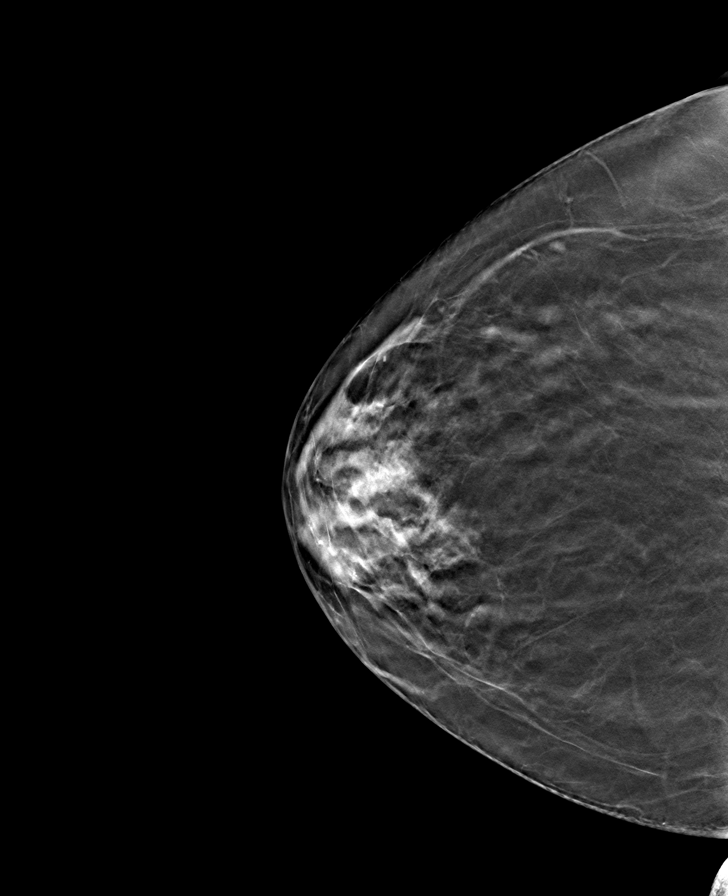

[8 of 24 positions shown; findings below may reference images not displayed]

FINDINGS: No suspicious mass, calcifications, or area of architectural 
distortion in either breast.
IMPRESSION: (BI-RADS 2) Benign findings. Routine mammographic follow-up is recommended.

## 2022-09-26 ENCOUNTER — Encounter
Admit: 2022-09-26 | Payer: PRIVATE HEALTH INSURANCE | Attending: Vascular and Interventional Radiology | Primary: Internal Medicine

## 2023-01-11 ENCOUNTER — Encounter: Admit: 2023-01-11 | Payer: MEDICARE | Attending: Internal Medicine | Primary: Internal Medicine

## 2023-02-01 ENCOUNTER — Encounter: Admit: 2023-02-01 | Payer: PRIVATE HEALTH INSURANCE | Attending: Internal Medicine | Primary: Internal Medicine

## 2023-02-03 ENCOUNTER — Encounter: Admit: 2023-02-03 | Payer: PRIVATE HEALTH INSURANCE | Attending: Internal Medicine | Primary: Internal Medicine

## 2023-02-03 MED ORDER — AMLODIPINE 10 MG TABLET
10 | ORAL_TABLET | Freq: Every day | ORAL | 1 refills | 90.00000 days | Status: AC
Start: 2023-02-03 — End: 2023-03-14

## 2023-02-07 ENCOUNTER — Encounter: Admit: 2023-02-07 | Payer: PRIVATE HEALTH INSURANCE | Attending: Internal Medicine | Primary: Internal Medicine

## 2023-02-07 MED ORDER — DOXYCYCLINE HYCLATE 100 MG CAPSULE
100 | Freq: Two times a day (BID) | ORAL | 1 refills | 7.00000 days | Status: AC
Start: 2023-02-07 — End: 2023-03-16

## 2023-02-10 ENCOUNTER — Inpatient Hospital Stay: Admit: 2023-02-10 | Discharge: 2023-02-10 | Payer: MEDICARE | Primary: Internal Medicine

## 2023-02-10 DIAGNOSIS — I10 Essential (primary) hypertension: Secondary | ICD-10-CM

## 2023-02-10 MED ORDER — PERFLUTREN LIPID MICROSPHERES 1.3 ML/10 ML NS INJ (IN
Freq: Once | INTRAVENOUS | Status: CP | PRN
Start: 2023-02-10 — End: ?
  Administered 2023-02-10: 12:00:00 10.000 mL via INTRAVENOUS

## 2023-02-20 ENCOUNTER — Encounter: Admit: 2023-02-20 | Payer: MEDICARE | Attending: Internal Medicine | Primary: Internal Medicine

## 2023-02-25 ENCOUNTER — Encounter: Admit: 2023-02-25 | Payer: PRIVATE HEALTH INSURANCE | Attending: Internal Medicine | Primary: Internal Medicine

## 2023-03-14 ENCOUNTER — Encounter: Admit: 2023-03-14 | Payer: MEDICARE | Attending: Internal Medicine | Primary: Internal Medicine

## 2023-03-14 MED ORDER — AMLODIPINE 5 MG TABLET
5 | ORAL_TABLET | Freq: Every day | ORAL | 1 refills | 90.00000 days | Status: AC
Start: 2023-03-14 — End: ?

## 2023-04-11 ENCOUNTER — Encounter
Admit: 2023-04-11 | Payer: PRIVATE HEALTH INSURANCE | Attending: Vascular and Interventional Radiology | Primary: Internal Medicine

## 2023-04-17 ENCOUNTER — Encounter: Admit: 2023-04-17 | Payer: PRIVATE HEALTH INSURANCE | Attending: Internal Medicine | Primary: Internal Medicine

## 2023-05-08 ENCOUNTER — Encounter
Admit: 2023-05-08 | Payer: PRIVATE HEALTH INSURANCE | Attending: Vascular and Interventional Radiology | Primary: Internal Medicine

## 2023-05-18 ENCOUNTER — Encounter: Admit: 2023-05-18 | Payer: PRIVATE HEALTH INSURANCE | Attending: Internal Medicine | Primary: Internal Medicine

## 2023-05-18 ENCOUNTER — Encounter
Admit: 2023-05-18 | Payer: PRIVATE HEALTH INSURANCE | Attending: Vascular and Interventional Radiology | Primary: Internal Medicine

## 2023-05-18 MED ORDER — ATORVASTATIN 10 MG TABLET
10 | ORAL_TABLET | Freq: Every day | ORAL | 1 refills | 90.00000 days | Status: AC
Start: 2023-05-18 — End: 2023-08-15

## 2023-06-20 IMAGING — CT CT CALCIUM SCORING
1 series · 15 of 20 positions shown, 19 images · non-contrast
Comparison: There are no previous exams available for comparison.

________________________________________________________________________________________________ 
CT CALCIUM SCORING, 06/20/2023 [DATE]:
INDICATION: Hyperlipidemia, Unspecified

[Series 2: cascoreseq 3.0 b35s 60% · axial · 0.42mm/px · z∈[-209,-88]mm · 15 of 91 slices shown, 19 images]
[im 5/91  vessel]
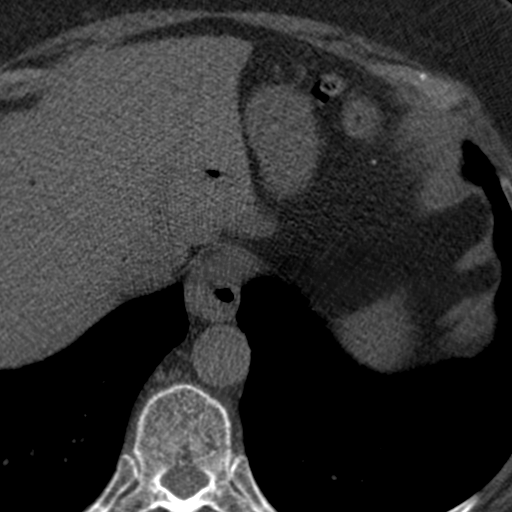
[im 5/91  lung]
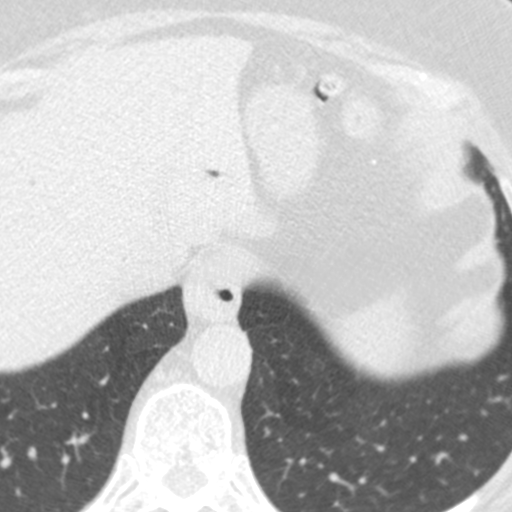
[im 10/91  vessel]
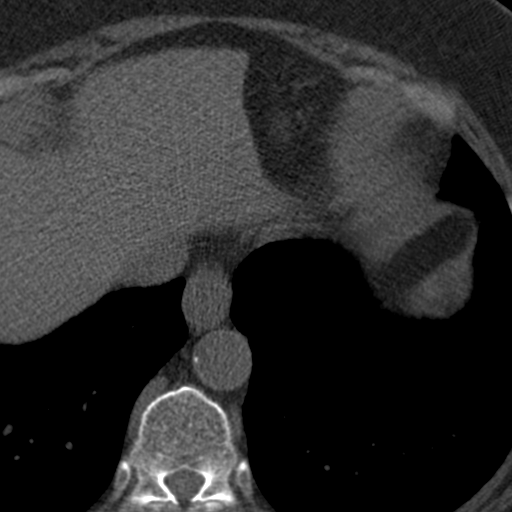
[im 19/91  vessel]
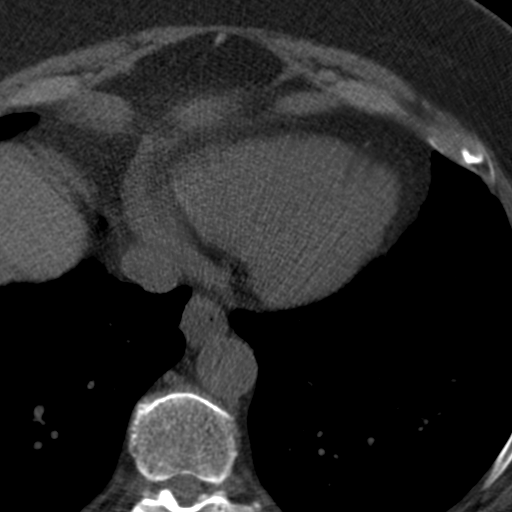
[im 24/91  vessel]
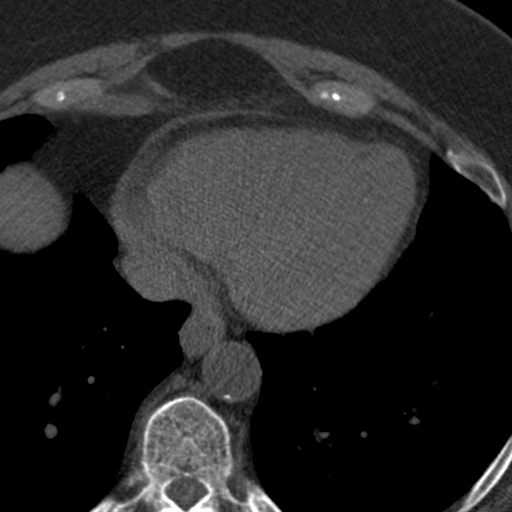
[im 29/91  vessel]
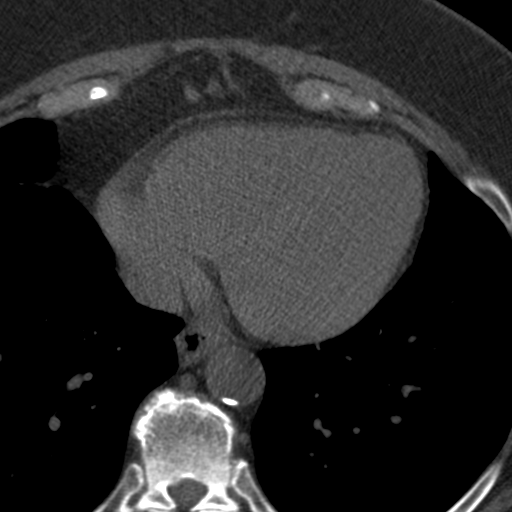
[im 29/91  lung]
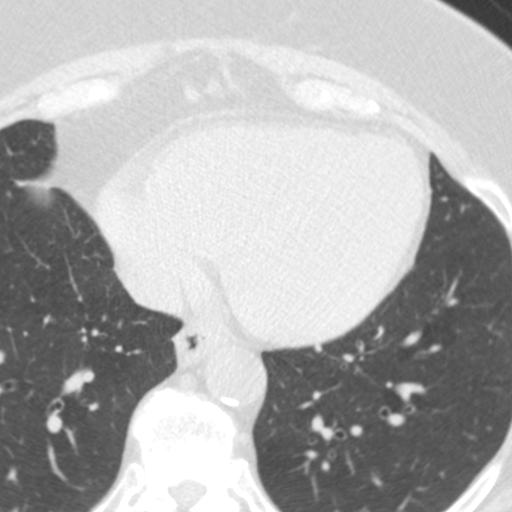
[im 34/91  vessel]
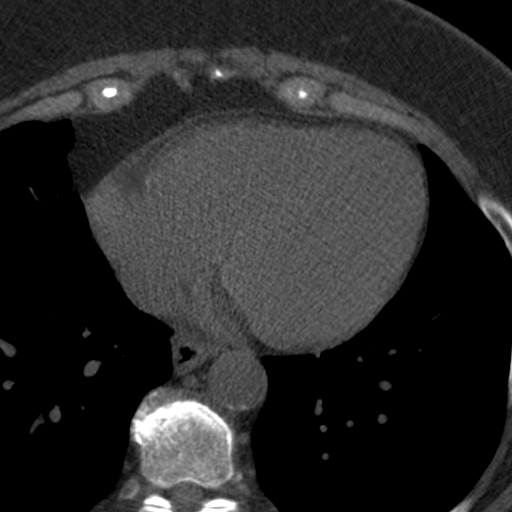
[im 38/91  vessel]
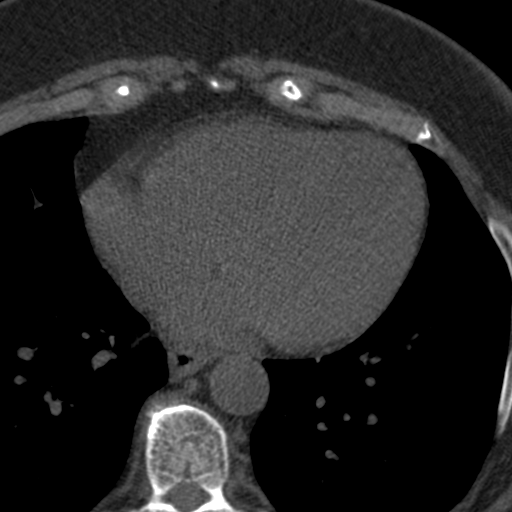
[im 48/91  vessel]
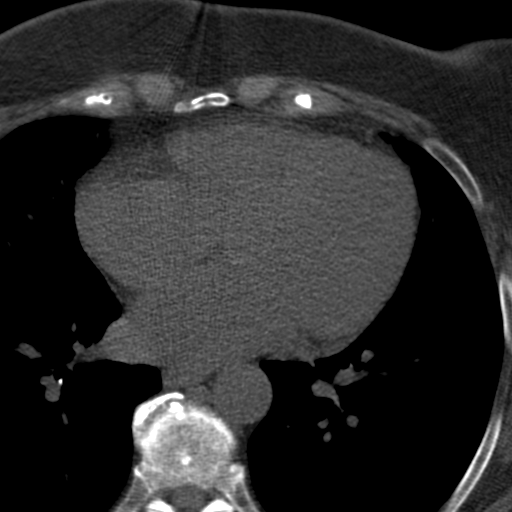
[im 53/91  vessel]
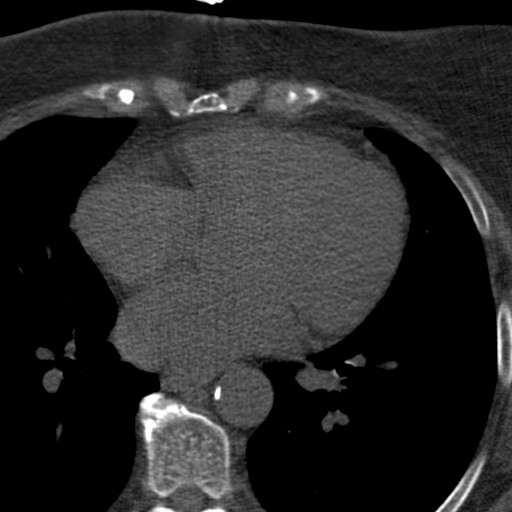
[im 53/91  lung]
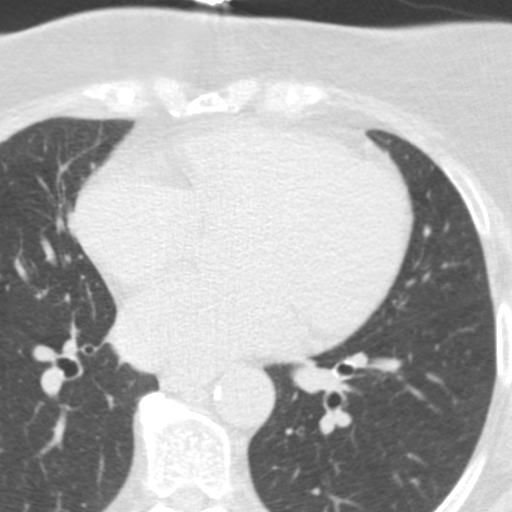
[im 57/91  vessel]
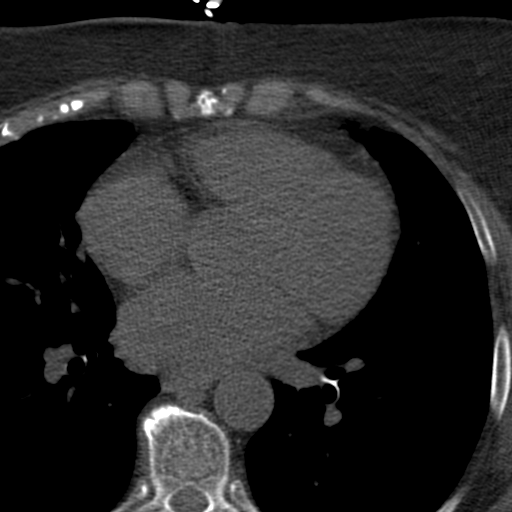
[im 62/91  vessel]
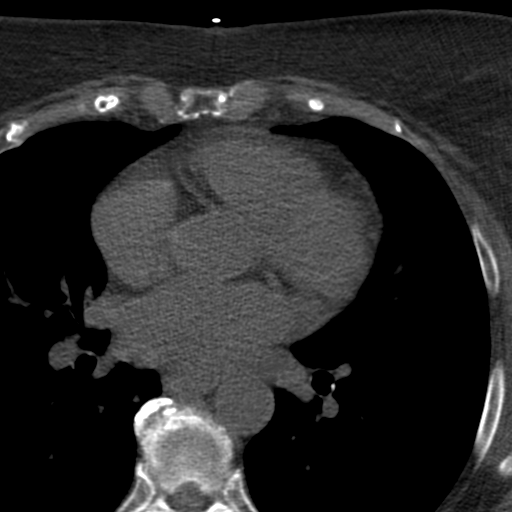
[im 67/91  vessel]
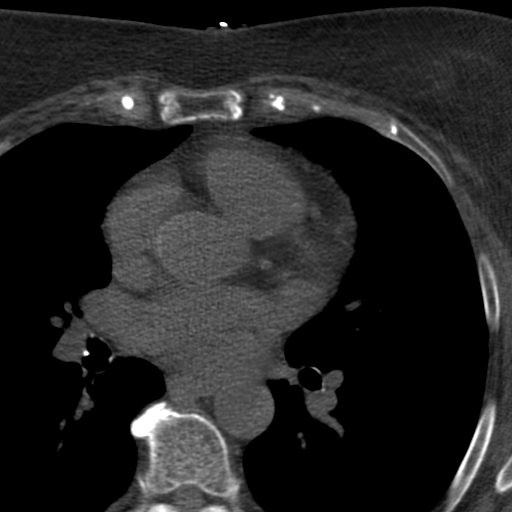
[im 76/91  vessel]
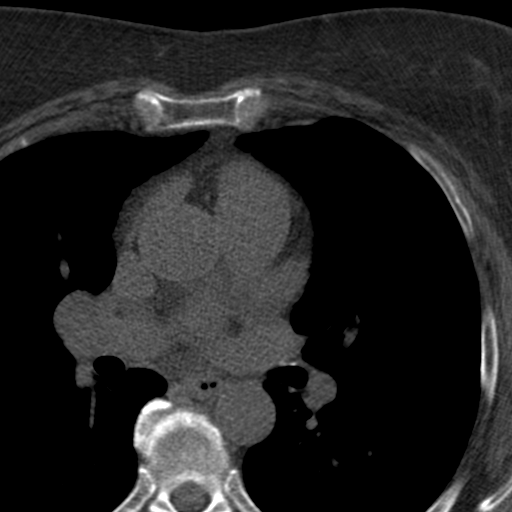
[im 76/91  lung]
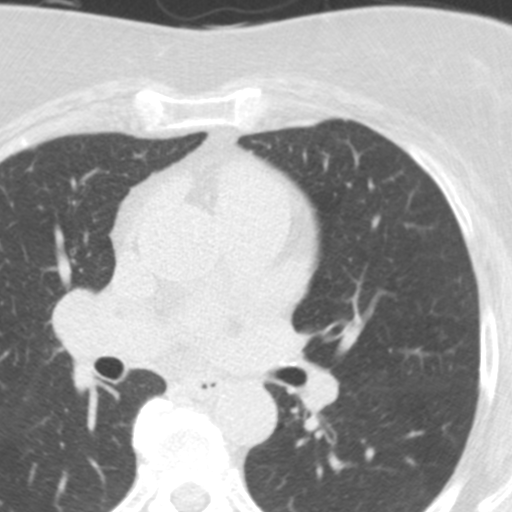
[im 81/91  vessel]
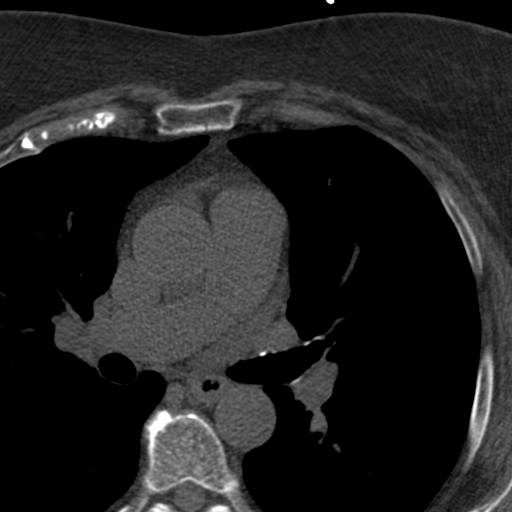
[im 86/91  vessel]
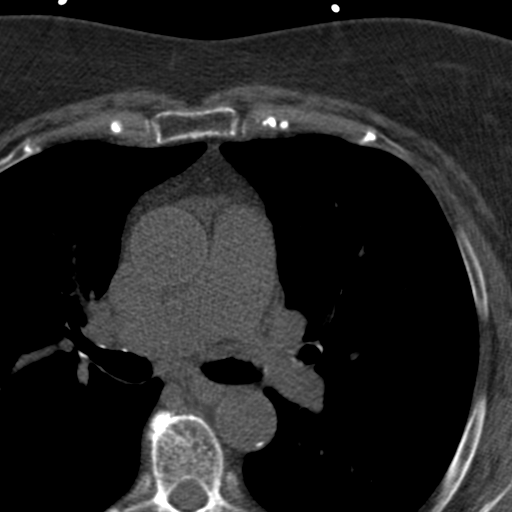

[15 of 20 positions shown; findings below may reference images not displayed]

Count of known CT and Cardiac Nuclear Medicine studies performed in the previous 
12 months = 0
FINDINGS: Visualized lungs are clear. Atherosclerosis. Degenerative change. 
Small hiatal hernia. 
Left Main (LM) Score: 0 
Left Anterior Descending Artery (LAD) Score: 0 
Circumflex (CM) Score: 0 
Right Coronary Artery (RCA) Score: 0 
Posterior Descending Artery (PDA) Score: 0
IMPRESSION: Total Calcium Score: 0 
If calcium score is greater than 100, and the patient is symptomatic with chest 
pain or shortness of breath, would recommend follow-up with coronary CTA with 
[place] plaque analysis. 
Calcium scoring sheets to follow. 
RADIATION DOSE REDUCTION: All CT scans are performed using radiation dose 
reduction techniques, when applicable.  Technical factors are evaluated and 
adjusted to ensure appropriate moderation of exposure.  Automated dose 
management technology is applied to adjust the radiation doses to minimize 
exposure while achieving diagnostic quality images. 
Consider further evaluation if multi-vessel or left-main predominant disease is 
present. 
Calcium score                                     Presence of Plaque 
0                                                    No evidence of plaque 
1-10                                               Minimal evidence of plaque 
11-100                                            Mild evidence of plaque 
101-400                                          Moderate evidence of plaque 
Over 400                                        Extensive evidence of plaque

## 2023-06-20 IMAGING — MG MAMMOGRAPHY SCREENING BILATERAL 3[PERSON_NAME]
8 series · 8 of 24 positions shown · non-contrast
Comparison: Comparison was made to prior examinations.

________________________________________________________________________________________________ 
MAMMOGRAPHY SCREENING BILATERAL 3HYKMETE ISLEYEN, 06/20/2023 [DATE]: 
CLINICAL INDICATION: Encounter for screening mammogram.
TECHNIQUE: Digital bilateral mammograms and 3-D Tomosynthesis were obtained. 
These were interpreted both primarily and with the aid of computer-aided 
detection system.  
BREAST DENSITY: (Level C) The breasts are heterogeneously dense, which may 
obscure small masses.

[R CC]
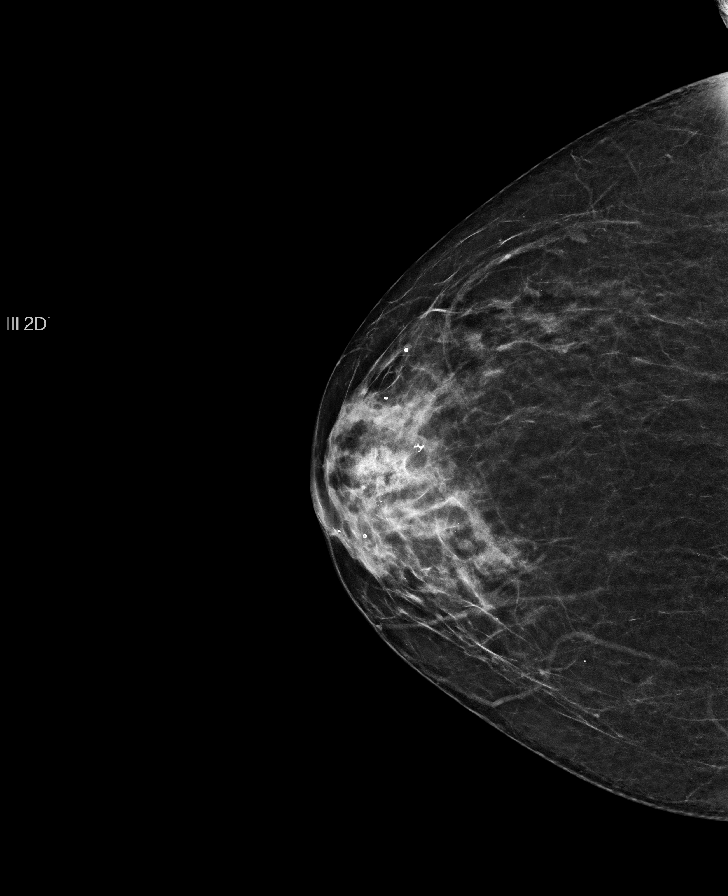

[L MLO]
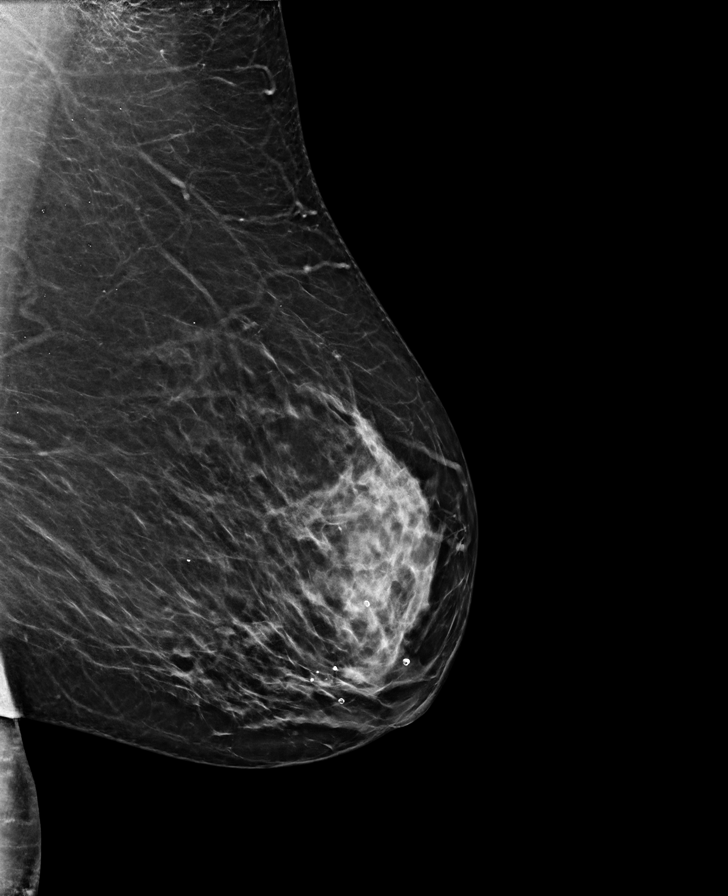

[L CC]
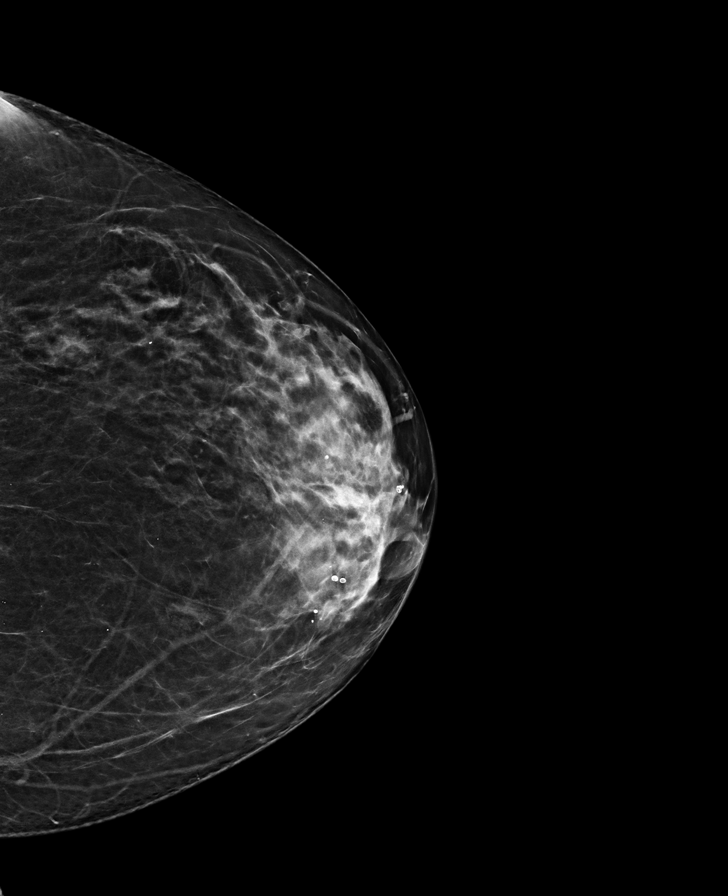

[R MLO]
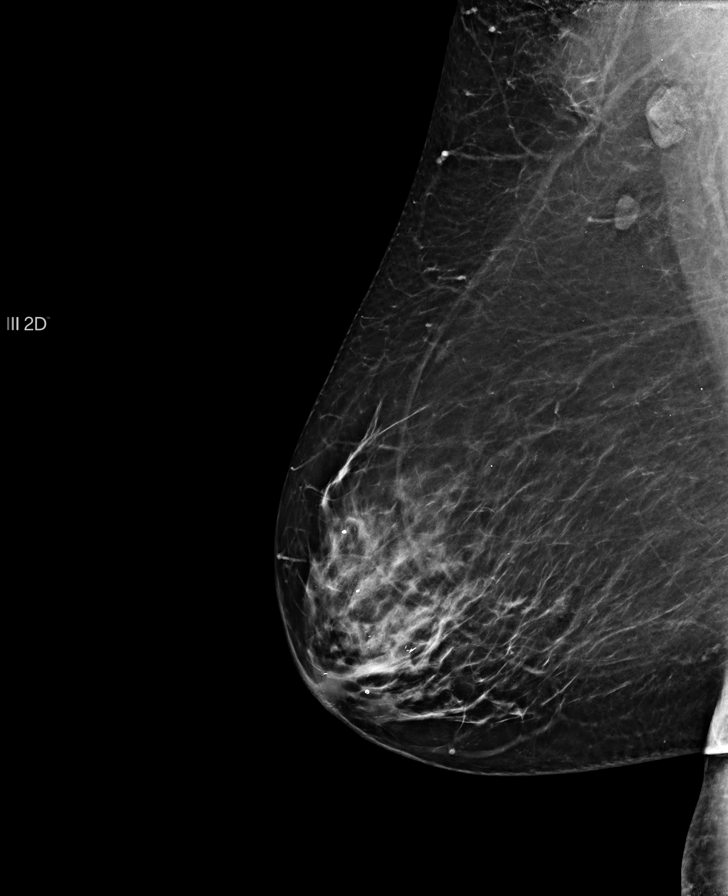

[L MLO tomo · tomo slice 14/27.0]
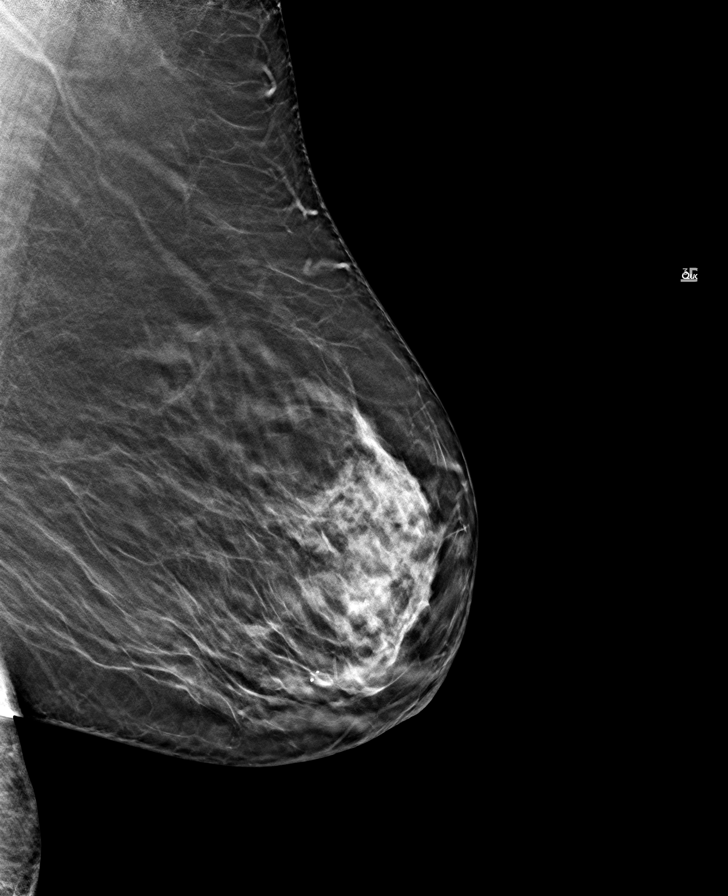

[L CC tomo · tomo slice 12/23.0]
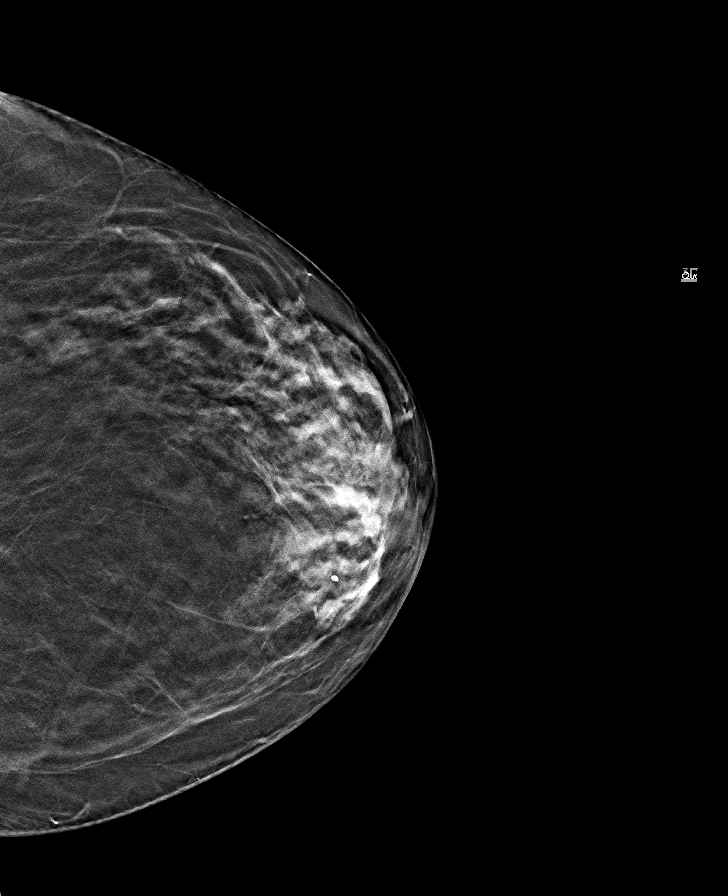

[R MLO tomo · tomo slice 15/28.0]
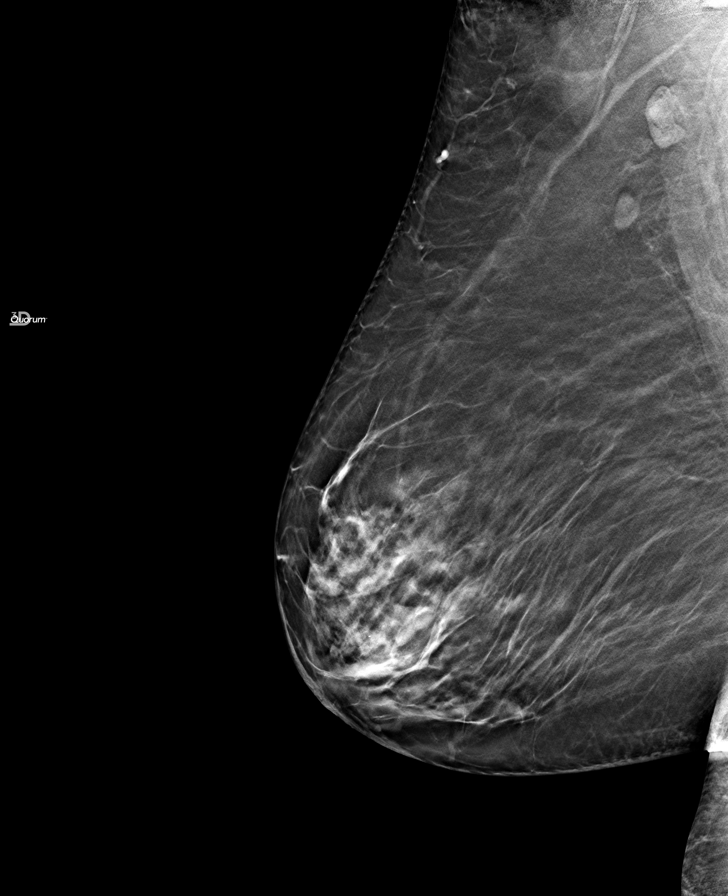

[R CC tomo · tomo slice 11/22.0]
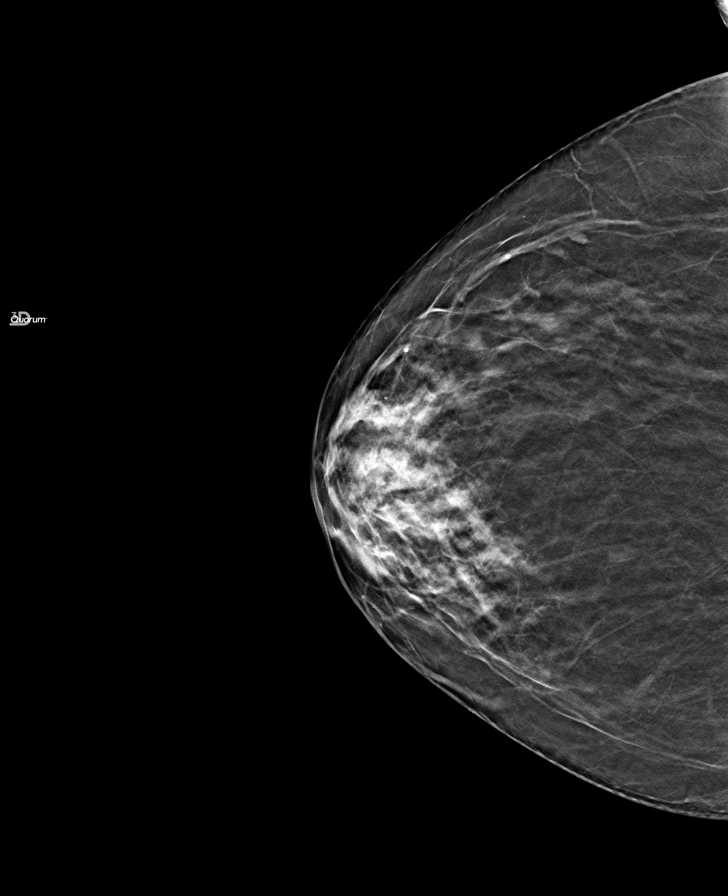

[8 of 24 positions shown; findings below may reference images not displayed]

FINDINGS: Benign calcification. No suspicious mass, calcifications, or area of 
architectural distortion in either breast.
IMPRESSION: Stable mammogram. 
(BI-RADS 2) Benign findings. Routine mammographic follow-up is recommended.

## 2023-07-04 ENCOUNTER — Encounter
Admit: 2023-07-04 | Payer: PRIVATE HEALTH INSURANCE | Attending: Vascular and Interventional Radiology | Primary: Internal Medicine

## 2023-08-01 IMAGING — DX LUMBAR SPINE COMPLETE 4 VIEWS
4 series · 4 of 4 positions shown · non-contrast
Comparison: MR exam of 06/03/2021

________________________________________________________________________________________________ 
LUMBAR SPINE COMPLETE 4 VIEWS, 08/01/2023 [DATE]: 
CLINICAL INDICATION: Severe low back pain for 4 days after bending.

[AP]
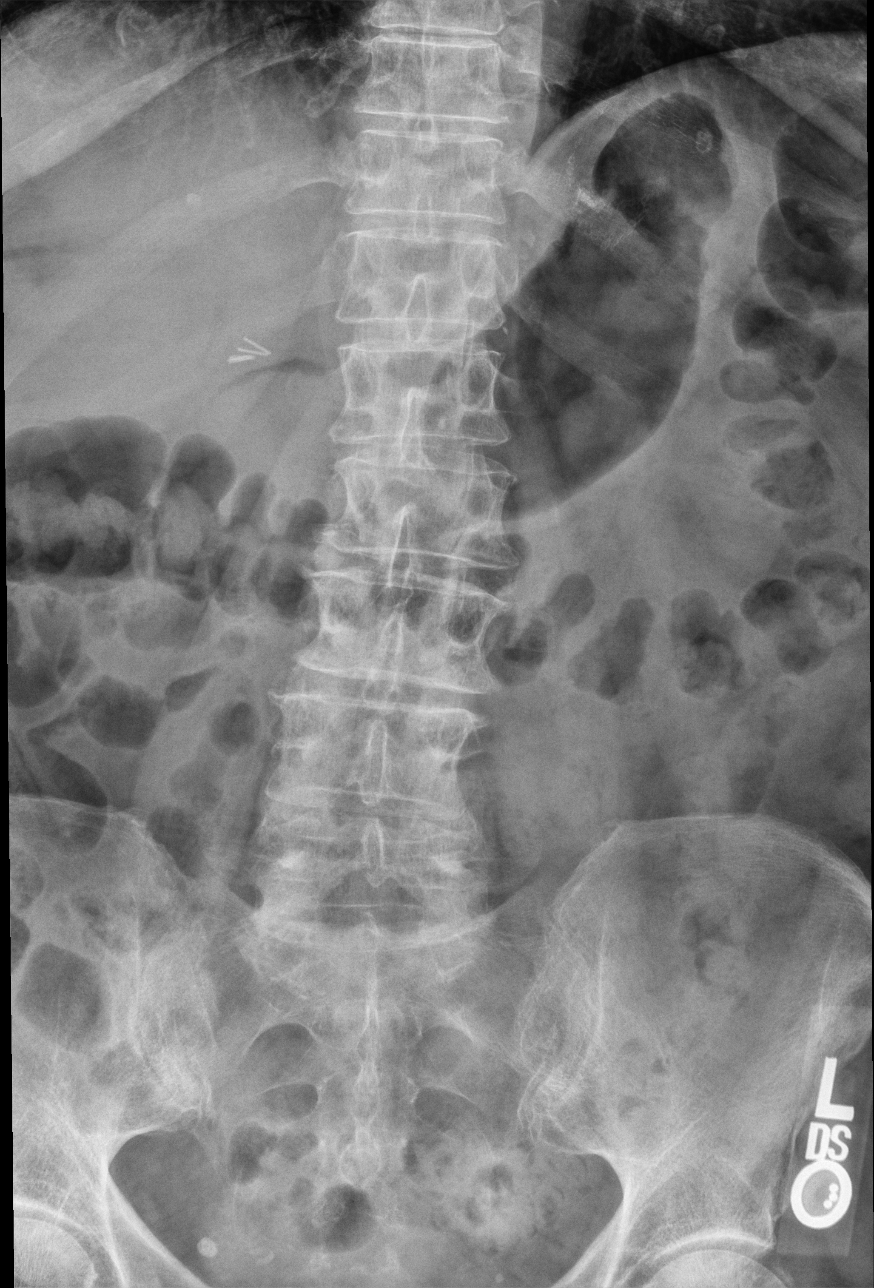

[rpo]
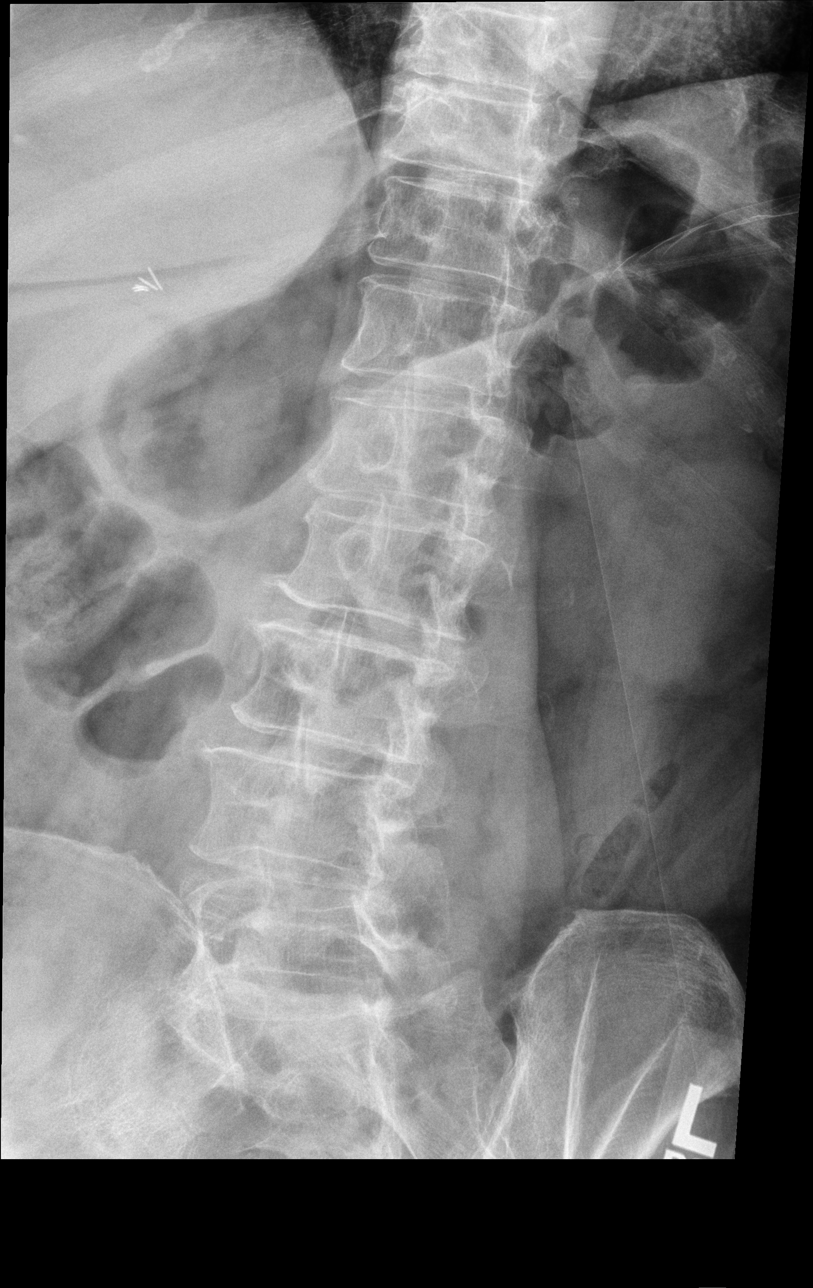

[lpo]
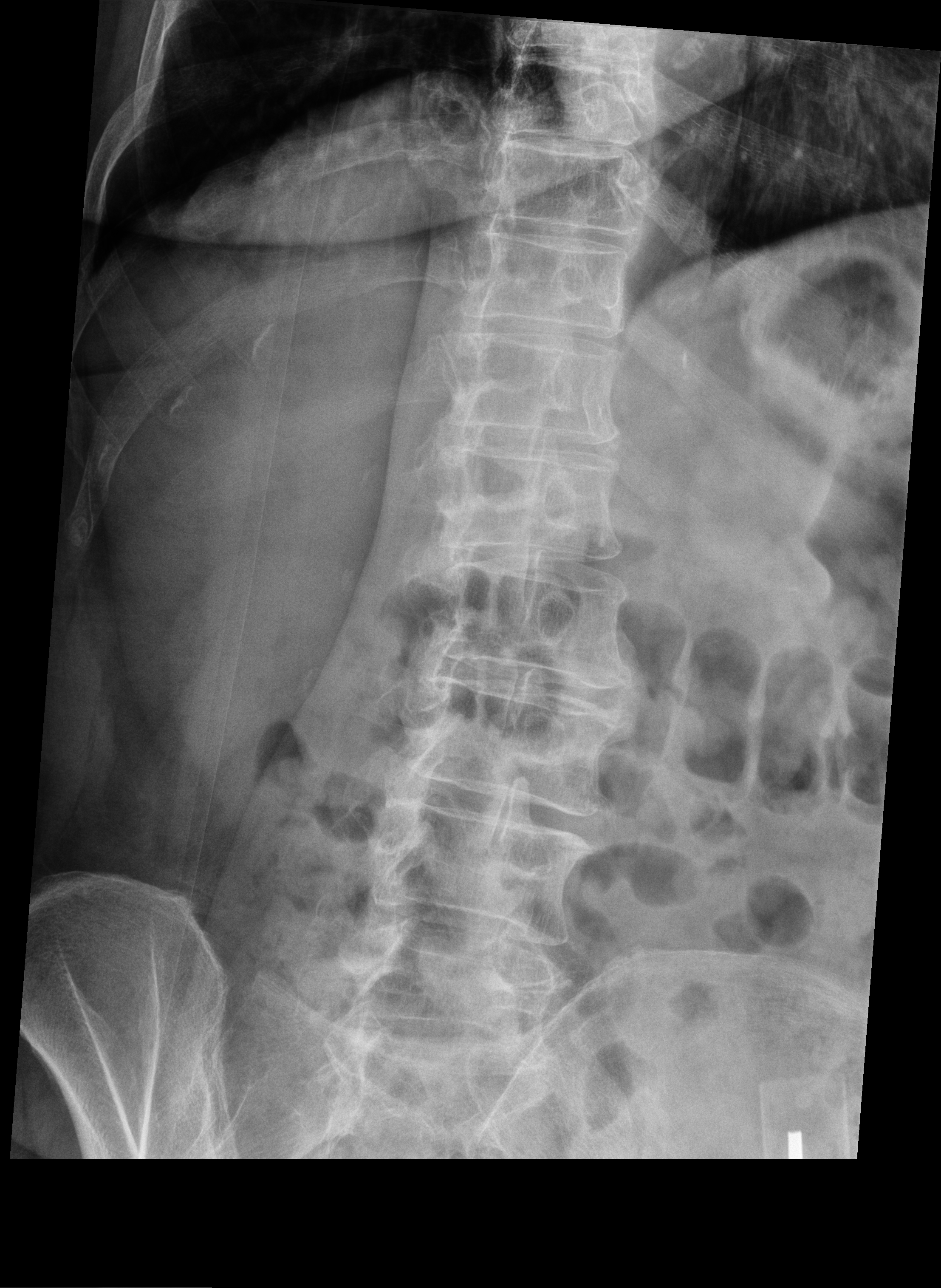

[lateral]
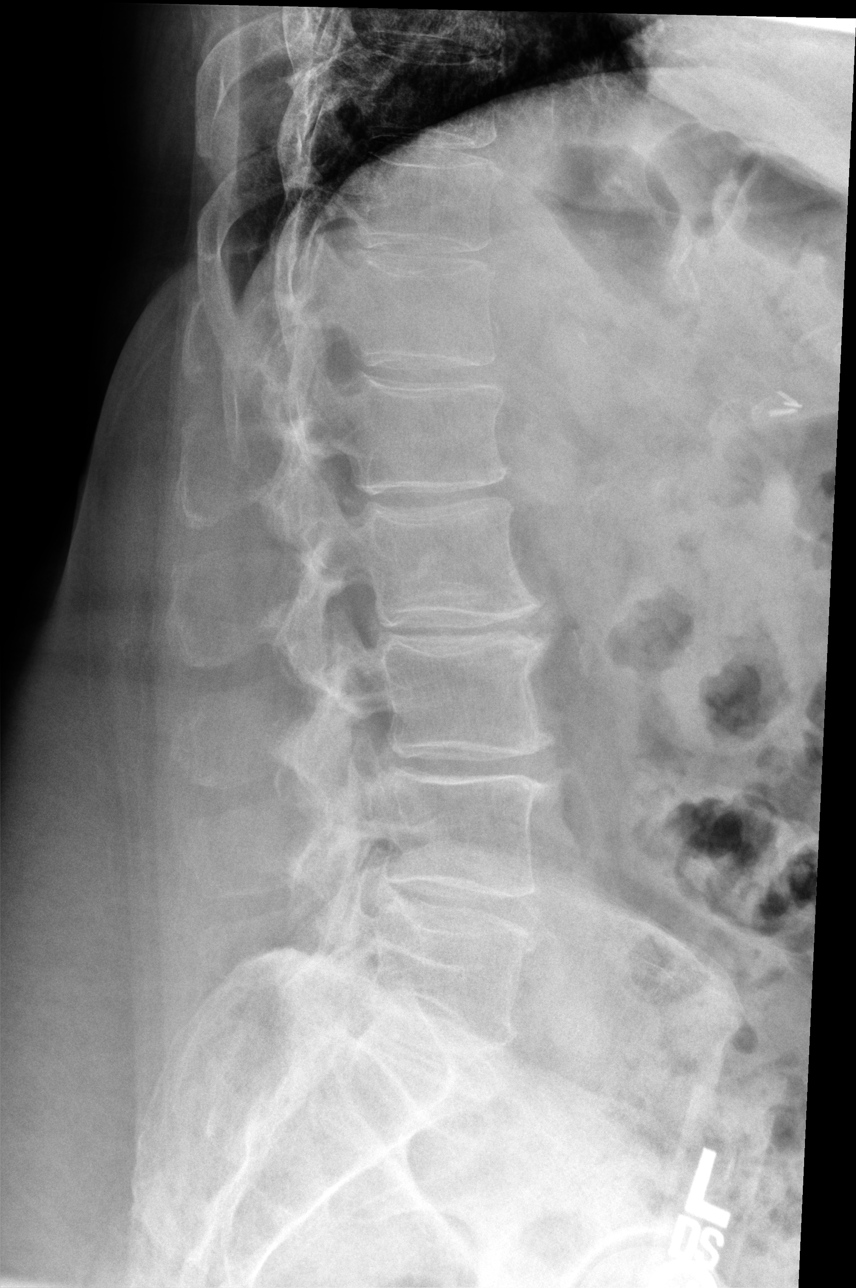

[4 of 4 positions shown; findings below may reference images not displayed]

FINDINGS: Mild lumbar levocurvature. Scattered disc height loss and osteophytic 
spurring. There is lumbar facet hypertrophy. No vertebral body fracture. No 
spondylolisthesis. Surgical clips right upper quadrant. Pelvic phlebolith.
IMPRESSION: Lumbar levocurvature and moderately advanced spondylotic changes. 
Patient is scheduled for MR exam on 08/10/2023.

## 2023-08-10 IMAGING — MR MRI LUMBAR SPINE WITHOUT CONTRAST
6 of 8 series · 12 of 48 positions shown · IV contrast (gadolinium)
Comparison: MRI lumbar spine from June 03, 2021.

________________________________________________________________________________________________ 
MRI LUMBAR SPINE WITHOUT CONTRAST, 08/10/2023 [DATE]: 
CLINICAL INDICATION: Low back pain for 2 weeks.
TECHNIQUE: Multiplanar, multiecho position MR images of the lumbar spine were 
performed without intravenous gadolinium enhancement. Patient was scanned on a 
1.5T magnet

[Series 101: survey · axial · 10.0mm · 1.25mm/px · z∈[-33,+201]mm · 2 of 10 slices shown]
[im 1/10]
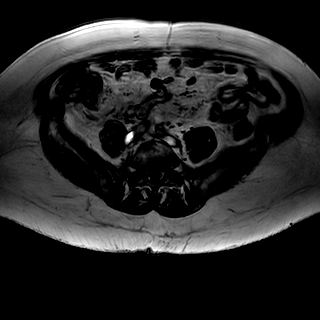
[im 10/10]
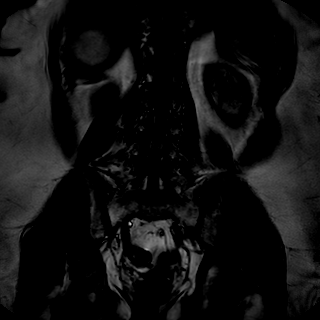

[Series 201: t2w_cor-surv · coronal · 6.0mm · 0.62mm/px · 2 of 10 slices shown]
[im 1/10]
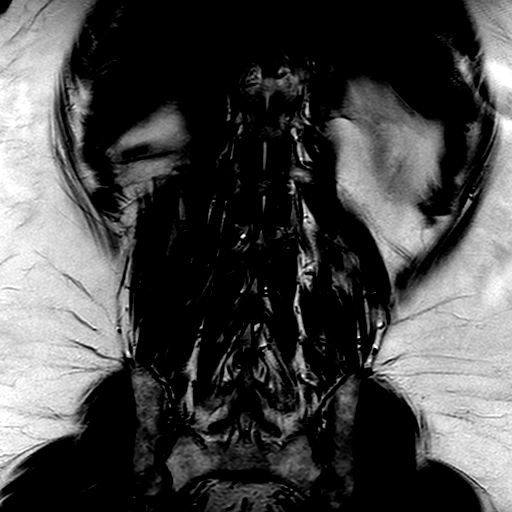
[im 10/10]
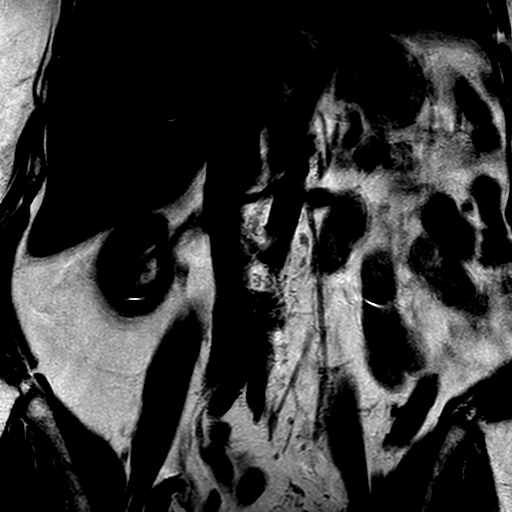

[Series 302: (id)_mdixon_tse · sagittal · 4.0mm · 0.51mm/px · 2 of 19 slices shown]
[im 1/19]
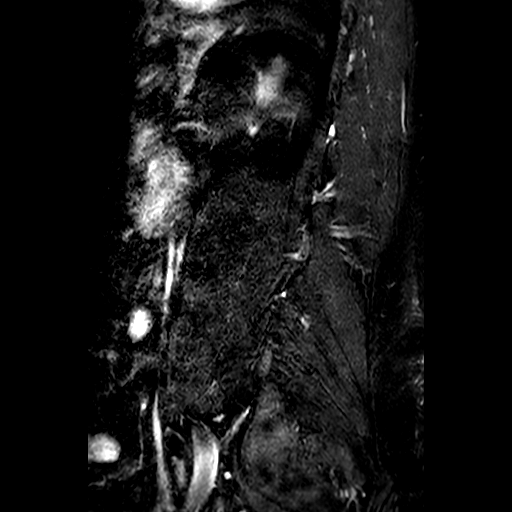
[im 19/19]
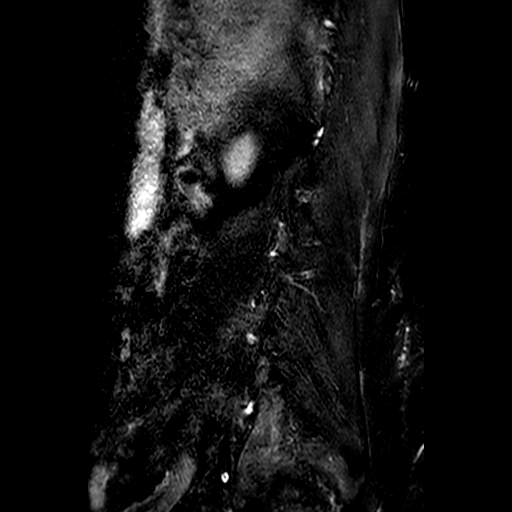

[Series 303: st2w_mdixon_tse · sagittal · 4.0mm · 0.51mm/px · 2 of 19 slices shown]
[im 1/19]
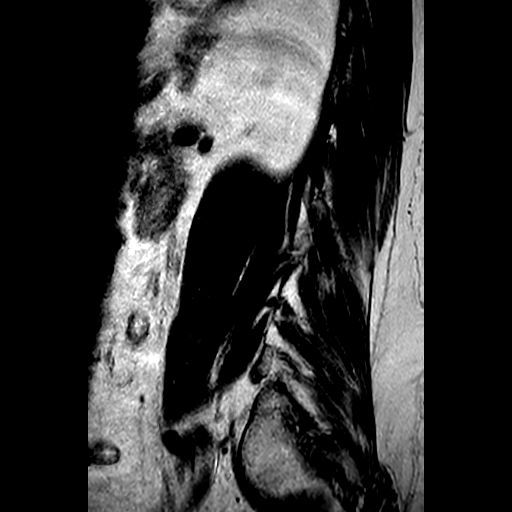
[im 19/19]
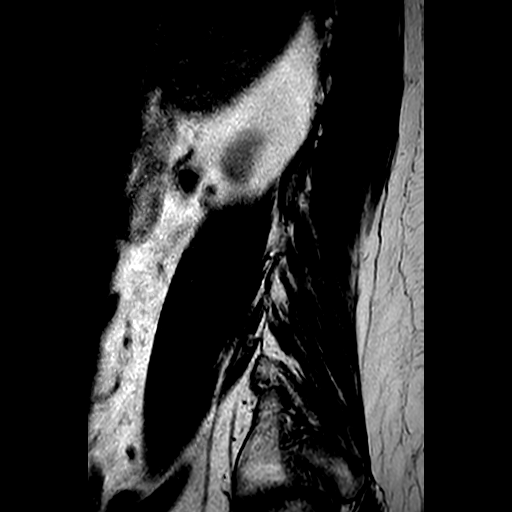

[Series 401: t1_tse_sag · sagittal · 4.0mm · 0.54mm/px · 2 of 19 slices shown]
[im 1/19]
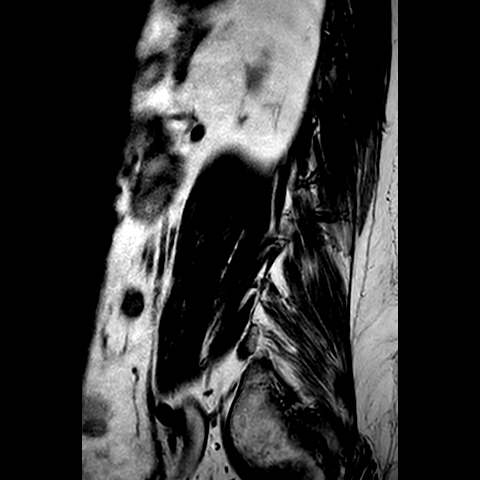
[im 19/19]
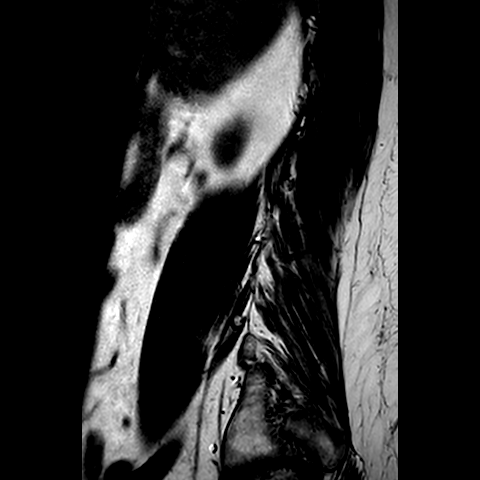

[Series 502: (id) view_ax mpr · axial · 1.0mm · 0.25mm/px · z∈[-52,-28]mm · 2 of 149 slices shown]
[im 9/149]
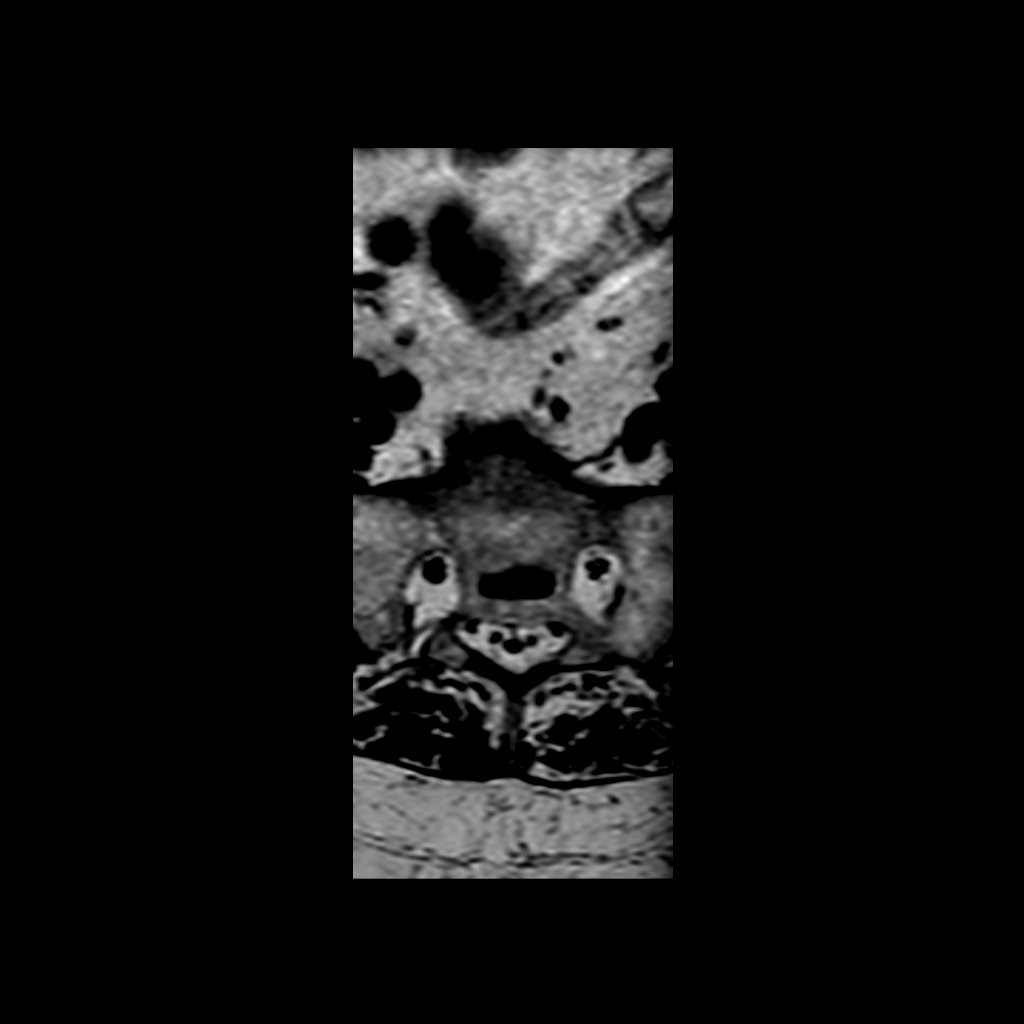
[im 25/149]
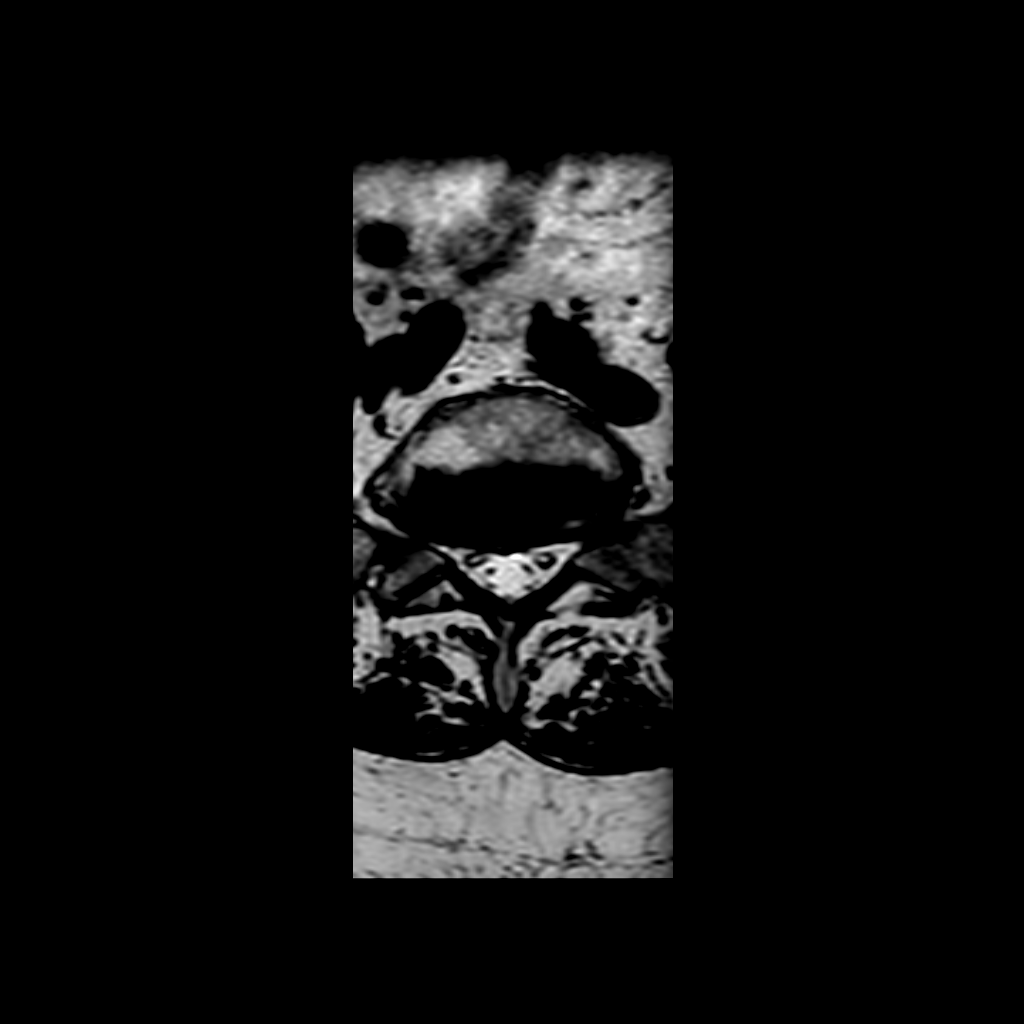

[12 of 48 positions shown; findings below may reference images not displayed]

FINDINGS: -------------------------------------------------------------------------------- 
------ 
GENERAL: 
Nomenclature is based on 5 lumbar type vertebral bodies.     
ALIGNMENT: Mild retrolisthesis of L1 on L2. 
VERTEBRAL BODY HEIGHT: Normal.  
MARROW SIGNAL: Stable atypical hemangioma within the L2 vertebral body. 
CORD SIGNAL: Normal distal spinal cord and cauda equina.  Conus terminates at 
L1-L2. 
ADDITIONAL FINDINGS: None. 
Modic I-II: Modic change at L5-S1. 
Ligamentum Flavum > 2.5 mm: All levels. 
-------------------------------------------------------------------------------- 
------ 
SEGMENTAL: 
T12-L1: No significant central canal narrowing.  No significant right neural 
foraminal narrowing. No significant left neural foraminal narrowing.  
L1-L2: Disc bulge, left distal foraminal disc herniation; no significant central 
canal narrowing.  No significant right neural foraminal narrowing. Moderate left 
neural foraminal narrowing.  
L2-L3: Trace disc bulge; no significant central canal narrowing.  No significant 
right neural foraminal narrowing. No significant left neural foraminal 
narrowing.  
L3-L4: Disc bulge, bilateral facet/ligamentum flavum hypertrophy; mild central 
canal and bilateral subarticular recess narrowing.  Mild right neural foraminal 
narrowing. Mild left neural foraminal narrowing.  
L4-L5: Disc bulge, central to right subarticular disc herniation, bilateral 
facet/ligamentum flavum hypertrophy; mild central canal narrowing with moderate 
right subarticular recess narrowing (the disc herniation does cause posterior 
displacement of the traversing right L5 nerve root).  Mild right neural 
foraminal narrowing. Mild left neural foraminal narrowing.  
L5-S1: Disc bulge; no significant central canal narrowing.  Mild right neural 
foraminal narrowing. Mild left neural foraminal narrowing.  
-------------------------------------------------------------------------------- 
------ 
CHANGES FROM PRIOR: 
There has been interval improvement in the left L1-L2 foraminal disc herniation 
with decreased mass effect upon the left exiting L1 nerve root compared to prior 
exam. 
There has been progression of disc herniation at L4-L5 with progression of right 
subarticular recess narrowing and mass effect upon the traversing right L5 root. 
-------------------------------------------------------------------------------- 
------
IMPRESSION: 1.  Discogenic/degenerative changes as above. 
2.  Progression of disc herniation at L4-L5, please correlate for right L5 
radiculopathy.

## 2023-08-15 ENCOUNTER — Encounter: Admit: 2023-08-15 | Payer: PRIVATE HEALTH INSURANCE | Attending: Internal Medicine | Primary: Internal Medicine

## 2023-08-15 DIAGNOSIS — E66812 Obesity, Class II, BMI 35-39.9: Secondary | ICD-10-CM

## 2023-08-15 DIAGNOSIS — Z1239 Encounter for other screening for malignant neoplasm of breast: Secondary | ICD-10-CM

## 2023-08-15 DIAGNOSIS — M353 Polymyalgia rheumatica: Secondary | ICD-10-CM

## 2023-08-15 DIAGNOSIS — I10 Essential (primary) hypertension: Secondary | ICD-10-CM

## 2023-08-15 DIAGNOSIS — R7303 Prediabetes: Secondary | ICD-10-CM

## 2023-08-15 DIAGNOSIS — Z1211 Encounter for screening for malignant neoplasm of colon: Secondary | ICD-10-CM

## 2023-08-15 DIAGNOSIS — Z1231 Encounter for screening mammogram for malignant neoplasm of breast: Secondary | ICD-10-CM

## 2023-08-15 DIAGNOSIS — Z13220 Encounter for screening for lipoid disorders: Secondary | ICD-10-CM

## 2023-08-15 MED ORDER — ATORVASTATIN 10 MG TABLET
10 | ORAL_TABLET | ORAL | 1 refills | 90.00000 days | Status: AC
Start: 2023-08-15 — End: 2023-11-10

## 2023-08-16 ENCOUNTER — Encounter
Admit: 2023-08-16 | Payer: PRIVATE HEALTH INSURANCE | Attending: Vascular and Interventional Radiology | Primary: Internal Medicine

## 2023-08-17 ENCOUNTER — Telehealth: Admit: 2023-08-17 | Payer: PRIVATE HEALTH INSURANCE | Attending: Gastroenterology | Primary: Internal Medicine

## 2023-08-17 NOTE — Telephone Encounter
 1st attempt. Called pt in attempt to schedule Colon. Per pt she is currently in [facility] and had a Colonoscopy performed this past year with another facility. Pt advised she is aware Dr. Claudette Laws would like the results and she will be going to get a hard copy to provide to the MD. Pt was very pleasant and appreciative of the call. Pt seen elsewhere at this time due to her being in [facility] for 7 months and [place] only 5 month out of the year.

## 2023-08-20 ENCOUNTER — Encounter: Admit: 2023-08-20 | Payer: PRIVATE HEALTH INSURANCE | Attending: Internal Medicine | Primary: Internal Medicine

## 2023-09-08 ENCOUNTER — Encounter
Admit: 2023-09-08 | Payer: PRIVATE HEALTH INSURANCE | Attending: Vascular and Interventional Radiology | Primary: Internal Medicine

## 2023-11-10 MED ORDER — ATORVASTATIN 10 MG TABLET
10 | ORAL_TABLET | ORAL | 5 refills | 90.00000 days | Status: AC
Start: 2023-11-10 — End: ?

## 2023-12-15 ENCOUNTER — Encounter: Admit: 2023-12-15 | Payer: PRIVATE HEALTH INSURANCE | Primary: Internal Medicine

## 2023-12-15 MED ORDER — VALSARTAN 40 MG TABLET
40 | Freq: Every day | ORAL | 4.00 refills | 90.00000 days | Status: AC
Start: 2023-12-15 — End: ?

## 2023-12-15 MED ORDER — VALACYCLOVIR 1 GRAM TABLET
1000 | ORAL_TABLET | Freq: Three times a day (TID) | ORAL | 1 refills | 10.00000 days | Status: AC
Start: 2023-12-15 — End: ?

## 2023-12-15 NOTE — Telephone Encounter
 Nurse Triage NoteCharlene Loma reports rash. Patient endorses symptoms started today.Patient reports it looks like shingles. Hx of shingles in 2015. Rash noted to be small blisters on stomach. Patient denies fever, rash on face, chest, pain, SOB or other symptoms at this time. Office visit scheduled today at 11:30 with APRN. Strict call back advice provided to patient. DispositionPatient triaged using Schmitt-Thompson, per protocol patient advised to be seen today, appointment scheduledCare advice provided to the patient. Advised the patient to call back with any new or worsening symptoms. Patient verbalizes understanding.Escalation required: NoReason for Disposition Shingles rash (matches SYMPTOMS) and onset < 72 hours ago (3 days)Answer Assessment - Initial Assessment Questions1. APPEARANCE of RASH: Describe the rash.     Looks like shingles2. LOCATION: Where is the rash located?     Left side of belly3. NUMBER: How many spots are there?     4. SIZE: How big are the spots? (Inches, centimeters or compare to size of a coin)     Small little blisters 5. ONSET: When did the rash start?     Today 6. ITCHING: Does the rash itch? If Yes, ask: How bad is the itch?  (Scale 0-10; or none, mild, moderate, severe)    Moderate 7. PAIN: Does the rash hurt? If Yes, ask: How bad is the pain?  (Scale 0-10; or none, mild, moderate, severe)    Yes -moderate8. OTHER SYMPTOMS: Do you have any other symptoms? (e.g., fever)    No other symptoms.Protocols used: Rash or Redness - Localized-Adult-[place], Shingles (Zoster)-Adult-[place]

## 2023-12-25 ENCOUNTER — Encounter: Admit: 2023-12-25 | Payer: PRIVATE HEALTH INSURANCE | Attending: Internal Medicine | Primary: Internal Medicine

## 2024-01-16 ENCOUNTER — Encounter
Admit: 2024-01-16 | Payer: PRIVATE HEALTH INSURANCE | Attending: Vascular and Interventional Radiology | Primary: Internal Medicine

## 2024-04-09 ENCOUNTER — Encounter: Admit: 2024-04-09 | Payer: PRIVATE HEALTH INSURANCE | Primary: Internal Medicine

## 2024-04-09 NOTE — Telephone Encounter [36]
 Copied from CRM #[id number]. Topic: Clinical Triage Follow up>> Apr 09, 2024  1:55 PM Cheril B wrote:[facility] Connection to Care: Clinical Call Time of call:   1:55 PMCaller:  Mckinnon, CHARLENECaller's relationship to patient:  n/a  Calling from (lab, hospital, agency, etc.):  n/aReason for call : ProblemBrief description of caller symptoms: light and dizzy and loose hair and nause- month Is this a new or recurring problem: NewWhen did the symptoms start? 1 month agoDo you have any pain? NoBest telephone number for callback:   [phone] Best time to return call:   anytime Permission to leave message:  yesKyung SchedulerPt is calling she need a referral to see endocrinologist. She need to make appt as soon possible

## 2024-04-09 NOTE — Telephone Encounter [36]
 Nurse Triage NoteCharlene Walker The patient presents with waves of nausea, lightheadedness, and hair loss.She has been experiencing daily waves of nausea and lightheadedness for about a month. The nausea does not prevent eating or drinking, and there is no vomiting. The lightheadedness is not related to changes in position and is distinct from her known positional vertigo, which occurs at night when rolling over in bed.She is experiencing significant hair loss, which is more concerning to her than the other symptoms. She describes the hair loss as 'unbelievable' and alarming, given her history of having a full head of thick hair.No fever, chest pain, vomiting, diarrhea, bleeding, weakness, numbness, headaches, or difficulty breathing. She mentions having ocular migraines but does not experience headaches. She also notes that her vision affects her symptoms if she does not wear glasses.Advised to seek urgent care if symptoms worsen, such as inability to keep food down, fever, or severe pain.DispositionPatient triaged using Schmitt-Thompson, per protocol patient advised to be seen today, declines an appointment in the office due to continuity of care.Care advice provided to the patient. Advised the patient to call back with any new or worsening symptoms. Patient verbalizes understanding.Escalation required: YesReason for Disposition Lightheadedness (dizziness) present now, after 2 hours of rest and fluidsProtocols used: Dizziness-Adult-[place]

## 2024-04-26 ENCOUNTER — Encounter: Admit: 2024-04-26 | Payer: PRIVATE HEALTH INSURANCE | Attending: Internal Medicine | Primary: Internal Medicine

## 2024-04-26 VITALS — BP 130/66 | HR 78 | Ht 67.0 in | Wt 235.0 lb

## 2024-04-26 DIAGNOSIS — M79674 Pain in right toe(s): Secondary | ICD-10-CM

## 2024-04-26 DIAGNOSIS — H8111 Benign paroxysmal vertigo, right ear: Principal | ICD-10-CM

## 2024-04-26 MED ORDER — CELECOXIB 100 MG CAPSULE
100 | Freq: Two times a day (BID) | ORAL | 2.00 refills | 29.00000 days | Status: AC
Start: 2024-04-26 — End: ?

## 2024-04-26 MED ORDER — MECLIZINE 25 MG TABLET
25 | ORAL_TABLET | Freq: Three times a day (TID) | ORAL | 1 refills | 8.50000 days | Status: AC | PRN
Start: 2024-04-26 — End: ?

## 2024-04-29 ENCOUNTER — Ambulatory Visit: Admit: 2024-04-29 | Payer: MEDICARE | Attending: Internal Medicine | Primary: Internal Medicine

## 2024-04-29 ENCOUNTER — Inpatient Hospital Stay: Admit: 2024-04-29 | Discharge: 2024-04-29 | Payer: MEDICARE | Primary: Internal Medicine

## 2024-04-29 ENCOUNTER — Encounter: Admit: 2024-04-29 | Payer: PRIVATE HEALTH INSURANCE | Attending: Internal Medicine | Primary: Internal Medicine

## 2024-04-29 DIAGNOSIS — M79674 Pain in right toe(s): Principal | ICD-10-CM

## 2024-04-29 LAB — SEDIMENTATION RATE (ESR): BKR SEDIMENTATION RATE, ERYTHROCYTE: 8 mm/h (ref 0–20)

## 2024-04-29 LAB — URIC ACID: BKR URIC ACID: 3.2 mg/dL (ref 2.7–7.3)

## 2024-05-01 ENCOUNTER — Encounter
Admit: 2024-05-01 | Payer: PRIVATE HEALTH INSURANCE | Attending: Vascular and Interventional Radiology | Primary: Internal Medicine

## 2024-12-25 ENCOUNTER — Encounter: Admit: 2024-12-25 | Payer: PRIVATE HEALTH INSURANCE | Attending: Internal Medicine | Primary: Internal Medicine
